# Patient Record
Sex: Male | Born: 1985 | Race: White | Hispanic: No | Smoking: Current every day smoker
Health system: Southern US, Community
[De-identification: ages and names within clinical notes are randomized; demographics above are authoritative.]

## PROBLEM LIST (undated history)

## (undated) DIAGNOSIS — K219 Gastro-esophageal reflux disease without esophagitis: Secondary | ICD-10-CM

## (undated) DIAGNOSIS — F419 Anxiety disorder, unspecified: Secondary | ICD-10-CM

## (undated) DIAGNOSIS — R42 Dizziness and giddiness: Secondary | ICD-10-CM

## (undated) DIAGNOSIS — F32A Depression, unspecified: Secondary | ICD-10-CM

## (undated) DIAGNOSIS — F329 Major depressive disorder, single episode, unspecified: Secondary | ICD-10-CM

## (undated) DIAGNOSIS — Z972 Presence of dental prosthetic device (complete) (partial): Secondary | ICD-10-CM

## (undated) HISTORY — DX: Major depressive disorder, single episode, unspecified: F32.9

## (undated) HISTORY — DX: Dizziness and giddiness: R42

## (undated) HISTORY — DX: Depression, unspecified: F32.A

## (undated) HISTORY — DX: Anxiety disorder, unspecified: F41.9

---

## 2004-08-02 ENCOUNTER — Emergency Department: Payer: Self-pay | Admitting: Emergency Medicine

## 2005-10-21 ENCOUNTER — Emergency Department: Payer: Self-pay | Admitting: Emergency Medicine

## 2005-12-13 ENCOUNTER — Emergency Department: Payer: Self-pay | Admitting: Emergency Medicine

## 2005-12-16 ENCOUNTER — Ambulatory Visit: Payer: Self-pay | Admitting: Otolaryngology

## 2005-12-17 ENCOUNTER — Emergency Department: Payer: Self-pay | Admitting: Emergency Medicine

## 2007-09-16 HISTORY — PX: ULNAR NERVE TRANSPOSITION: SHX2595

## 2007-09-30 ENCOUNTER — Ambulatory Visit (HOSPITAL_BASED_OUTPATIENT_CLINIC_OR_DEPARTMENT_OTHER): Admission: RE | Admit: 2007-09-30 | Discharge: 2007-09-30 | Payer: Self-pay | Admitting: Orthopedic Surgery

## 2008-09-11 ENCOUNTER — Emergency Department: Payer: Self-pay | Admitting: Emergency Medicine

## 2008-09-13 ENCOUNTER — Emergency Department: Payer: Self-pay | Admitting: Emergency Medicine

## 2009-04-22 ENCOUNTER — Emergency Department (HOSPITAL_COMMUNITY): Admission: EM | Admit: 2009-04-22 | Discharge: 2009-04-22 | Payer: Self-pay | Admitting: Emergency Medicine

## 2010-12-22 LAB — BASIC METABOLIC PANEL
GFR calc Af Amer: >60 mL/min (ref >60)
GFR calc non Af Amer: >60 mL/min (ref >60)
Potassium: 3.1 mEq/L — ABNORMAL LOW (ref 3.5–5.1)
Sodium: 140 mEq/L (ref 135–145)

## 2010-12-22 LAB — URINALYSIS, ROUTINE W REFLEX MICROSCOPIC
Nitrite: NEGATIVE
Specific Gravity, Urine: 1.027 (ref 1.005–1.030)
pH: 6.5 (ref 5.0–8.0)

## 2010-12-22 LAB — CBC
HCT: 40.2 % (ref 39.0–52.0)
Hemoglobin: 13.7 g/dL (ref 13.0–17.0)
Platelets: 189 10*3/uL (ref 150–400)
WBC: 15.4 10*3/uL — ABNORMAL HIGH (ref 4.0–10.5)

## 2010-12-22 LAB — DIFFERENTIAL
Eosinophils Relative: 1 % (ref 0–5)
Lymphocytes Relative: 18 % (ref 12–46)
Lymphs Abs: 2.7 10*3/uL (ref 0.7–4.0)
Monocytes Absolute: 0.9 10*3/uL (ref 0.1–1.0)

## 2011-01-28 NOTE — Op Note (Signed)
NAMEMarland Kitchen  MYLAN, SCHWARZ            ACCOUNT NO.:  [id number]   MEDICAL RECORD NO.:  [id number]          PATIENT TYPE:  AMB   LOCATION:  DSC                          FACILITY:  [facility]   PHYSICIAN:  Cindee Salt, M.D.       DATE OF BIRTH:  1986-04-25   DATE OF PROCEDURE:  DATE OF DISCHARGE:                               OPERATIVE REPORT   PREOPERATIVE DIAGNOSIS:  Status post decompression ulnar nerve, left  elbow, with subluxation.   POSTOPERATIVE DIAGNOSIS:  Status post decompression ulnar nerve, left  elbow, with subluxation.   OPERATION:  Submuscular transposition ulnar nerve left elbow.   SURGEON:  Kuzma   ASSISTANT:  None.   ANESTHESIA:  General.   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  He has undergone a decompression but has  had continued discomfort,  complains of pain, numbness and tingling,  positive nerve conductions.  He is desirous of reexploration with  submuscular transposition.  He is aware of risks and complications  including the possibility of infection, recurrence, injury to arteries,  nerves, tendons incomplete relief of symptoms, dystrophy.  He is aware  that this may not improve his symptoms and may actually make him worse,  and the possibility of injury to the posterior branches of the medial  antebrachial cutaneous nerve of the forearm.  He is desirous of  proceeding.  In the preoperative area the patient is seen, questions  again encouraged and answered.  The extremity marked by both the patient  and surgeon.  Antibiotic given.   PROCEDURE:  The patient is brought to the operating room where a general  anesthetic was carried out without difficulty, was prepped using  DuraPrep, supine position, left arm free.  The limb was exsanguinated  with an Esmarch bandage, tourniquet placed high and the arm was inflated  to 250 mmHg.  The old incision was excised extended proximally and  distally on the  medial side of his elbow, carried down through  subcutaneous tissue.  Bleeders were electrocauterized.  The dissection  carefully carried down to the medial epicondyle.  The one branch of  posterior portion of the medial antebrachial cutaneous nerve of the  forearm was identified and protected.  The remaining branches were not  identified nor were they seen in the wound.  The dissection carried  down.  The bursa head formed around the ulnar nerve with  flexion/extension of the elbow.  This was noted to anteriorly dislocate.  With blunt and sharp dissection this was freed proximally to the arcade  of struthers.  Distally a fasciotomy of the flexor carpi ulnaris was  performed.  The medial intermuscular septum was then carefully dissected  and removed.  The fascia on the anterior aspect of the forearm, the  medial pronator flexor pronator mass was then mobilized.  The median  nerve was identified.  The flexor pronator mass was then dissected free  from the medial epicondyle, protecting the branches of the medial  collateral ligament.  This was then elevated.  The ulnar nerve was then  isolated with its vascular bundles.  It was anteriorly transposed next  to the median nerve.  After irrigation the flexor pronator mass was then  reapproximated with figure-of-eight 2-0 FiberWire sutures.  The arm was  placed through full range of motion, no kinking of the nerve was noted.  The wound was again irrigated.  The subcutaneous tissue was then closed  with figure-of-eight 4-0 Vicryl sutures and the skin with interrupted 4-  0 Vicryl Rapide sutures.  Sterile  compressive dressing long-arm posterior elbow splint applied  with the  elbow in flexion.  The wrist in neutral.  The patient tolerated the  procedure well and was taken to the recovery room for observation in  satisfactory condition.  He will be discharged home to return to the  hand center of [facility] in 1 week on Percocet.            ______________________________  Cindee Salt, M.D.     GK/MEDQ  D:  09/30/2007  T:  09/30/2007  Job:  [id number]

## 2011-04-16 ENCOUNTER — Emergency Department: Payer: Self-pay | Admitting: Emergency Medicine

## 2011-06-02 ENCOUNTER — Emergency Department: Payer: Self-pay | Admitting: Unknown Physician Specialty

## 2011-06-05 LAB — POCT HEMOGLOBIN-HEMACUE: Hemoglobin: 15.8

## 2011-07-16 ENCOUNTER — Ambulatory Visit: Payer: Self-pay | Admitting: Pain Medicine

## 2011-08-01 ENCOUNTER — Emergency Department: Payer: Self-pay | Admitting: Emergency Medicine

## 2011-08-20 ENCOUNTER — Ambulatory Visit: Payer: Self-pay | Admitting: Pain Medicine

## 2011-11-12 ENCOUNTER — Ambulatory Visit: Payer: Self-pay | Admitting: Pain Medicine

## 2011-12-03 ENCOUNTER — Ambulatory Visit: Payer: Self-pay | Admitting: Pain Medicine

## 2011-12-23 ENCOUNTER — Ambulatory Visit: Payer: Self-pay | Admitting: Pain Medicine

## 2011-12-29 ENCOUNTER — Ambulatory Visit: Payer: Self-pay | Admitting: Pain Medicine

## 2012-01-14 ENCOUNTER — Ambulatory Visit: Payer: Self-pay | Admitting: Pain Medicine

## 2012-01-28 ENCOUNTER — Ambulatory Visit: Payer: Self-pay | Admitting: Pain Medicine

## 2012-01-29 ENCOUNTER — Encounter: Payer: Self-pay | Admitting: Pain Medicine

## 2012-02-03 ENCOUNTER — Ambulatory Visit: Payer: Self-pay | Admitting: Pain Medicine

## 2012-02-10 ENCOUNTER — Ambulatory Visit: Payer: Self-pay | Admitting: Pain Medicine

## 2012-03-02 ENCOUNTER — Ambulatory Visit: Payer: Self-pay | Admitting: Pain Medicine

## 2012-04-09 ENCOUNTER — Emergency Department: Payer: Self-pay | Admitting: *Deleted

## 2013-08-15 ENCOUNTER — Ambulatory Visit: Payer: Self-pay | Admitting: Physician Assistant

## 2015-01-03 ENCOUNTER — Ambulatory Visit
Admit: 2015-01-03 | Disposition: A | Discharge status: Home / Self Care | Payer: Self-pay | Attending: Family Medicine | Admitting: Family Medicine

## 2015-01-07 NOTE — Op Note (Signed)
PATIENT NAME:  Jose Zimmerman, Jose Zimmerman MR#:  [id number] DATE OF BIRTH:  1986/04/02  DATE OF PROCEDURE:  12/23/2011  Location: Operating Room Referring Physician:  Dr. Daryll Brod  Consulting Pain Physician: Beatriz Chancellor A. Dossie Arbour, M.D.  Note:  This is the case of a 29 year old white male patient who comes into the clinic today for a left-sided cervical epidural spinal cord stimulator trial lead implant under fluoroscopic guidance and IV sedation.    Procedure(s):  1. Temporary (Trial) implantation of Cervical Epidural, Single Percutaneous Neurostimulator Lead (8 electrode array). 2. Fluoroscopic Needle Guidance 3. Intraoperative Analysis and Programming. 4. Postoperative Analysis and Programming. 5. Moderate Conscious Sedation  Surgeon: Clanton Emanuelson A. Dossie Arbour, M.D. 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  Position: Prone.  Prepping solution: DuraPrep Area prepped: The Cervical and Thoracic areas, were prepped with a broad-spectrum topical antiseptic microbicide. Target area: Cervical Epidural Space, around the C2/C3 vertebral body, for the electrode tip. Insertion site is the T1-3 intervertebral space. Level entered: T1-2. Number of attempts: One  Infection Control: Standard Universal Precautions taken (Respiratory Hygiene/Cough Etiquette; Mouth, nose, eye protection; Hand Hygiene; Personal protective equipment (PPE); safe injection practices; and use of masks and disposable sterile surgical gloves) as recommended by the Department of [place] for Disease Control and Prevention (CDC).  Safety Measures: Allergies were reviewed. Appropriate site, procedure, and patient were confirmed by following the Joint Commission's Universal Protocol (UP.01.01.01). The patient was asked to confirm marked site and procedure, before commencing.  The patient was asked about blood thinners, or active infections, both of which were denied. No attempt was made at seeking any paresthesias. Aspiration looking for blood return was conducted prior to injecting. At no point did we inject any substances, as a needle was being advanced.  Pre-procedure Assessment:  A medical history and physical exam were obtained. Relevant documentation was reviewed and verified. Prior to the procedure, the patient was provided with an Audio CD, as well as written information on the procedure, including side-effects, and possible complications. Under the influence of no sedatives, a verbal, as well as a written informed consent were obtained, after having provided information on the risks and possible complications. To fulfill our ethical and legal obligations, as recommended by the American Medical Association's Code of Ethics, we have provided information to the patient about our clinical impression; the nature and purpose of an available treatment or procedure; the risks and benefits of an available treatment or procedure; alternatives; the risk and benefits of the alternative treatment or procedure; and the risks and benefits of not receiving or undergoing a treatment or procedure. The patient was provided information about the risks and possible complications associated with the procedure. These include, but not limited to, failure to achieve desired goals, infection, bleeding, organ or nerve damage, allergic reactions, paralysis, and death. In addition, the patient was informed that Medicine is not an exact science; therefore, there is also the possibility of unforeseen risks and possible complications that may result in a catastrophic outcome. The patient indicated having understood very clearly.  We have given the patient no guarantees and we have made no promises. Ample time was given to the patient to ask questions, all of which were answered, to the patient's  satisfaction, before proceeding. The patient understands that by signing our informed consent form, they understand and accept the risks and the  fact that it is impossible to predict all possible complications. Baseline vital signs were taken and the medical assessment was completed. Verification of the correct person, correct site (including marking of site), and correct procedure were performed and confirmed by the patient. Baseline vital signs were taken and the initial assessment was completed. Verification of the correct person, correct site (including marking of site), and correct procedure were performed and confirmed by the patient, in the form of a "Time Out".  Monitoring: The patient was monitored in the usual manner, using NIBPM, ECG, and pulse oximetry.  IV Access:  An IV access was obtained and secured.  Analgesia:  Moderate (Conscious) Intravenous sedation: Consent was obtained before administering any sedation. Availability of a responsible, adult driver, and NPO status confirmed. Meaningful verbal contact was maintained, with the patient at all times during the procedure. ASA Sedation Guidelines followed. For specifics on pharmacological type and quantity of sedation, please see nursing chart.  Prophylactic Antibiotics:  Cefazolin (1st generation cephalosporin) 1 gm IVPB.  Local Anesthesia: Lidocaine 1%. The skin over the procedure site were infiltrated using a 3 ml Luer-Lok syringe with a 0.5 inch, 25-G needle. Deeper tissues were infiltrated using a 3.0 inch, 22-G spinal needle, under fluoroscopic guidance.  Fluoroscopy: The patient was taken to the operative suite, where the patient was placed in position for the procedure, over the fluoroscopy compatible table. Fluoroscopy was manipulated, using "Tunnel Vision Technique", to obtain the best possible view of the target area, on the affected side. Parallax error was corrected before commencing the procedure. Gabor Racz's  "Direction-Depth-Direction" technique was used to introduce the procedural needle under continuous pulsed fluoroscopic guidance. Once the target was reached, antero-posterior and lateral fluoroscopic views were taken to confirm needle placement in two planes. Fluoroscopy time: Please see the patient's chart for details.  Description of the procedure: The procedure site was prepped using a broad-spectrum topical antiseptic. The area was then draped in the usual and standard manner. "Time-out" was performed as per JC Universal Protocol (UP.01.01.01).   The skin and deeper tissues over the procedure site were infiltrated using 1% lidocaine, loaded in a 10 ml Luer-Lok syringe with a 0.5 inch, 25-G needle. The procedural needle was then introduced through the skin and deeper tissues, using Gabor Racz's "Direction-Depth-Direction" technique, under pulsed fluoroscopic guidance. No attempt was made at seeking a paresthesia. The paramidline approach was used to enter the posterior epidural space at a 30 degree angle, using "Loss-of-resistance Technique" with 3 ml of PF-NaCl (0.9% NSS) + 0.5 ml of air, in a 5 ml glass syringe, using a "loss-of-bounce technique", at the desired level. Correct needle placement was confirmed in the  antero-posterior and lateral fluoroscopic views. The epidural lead was gently introduced under real-time fluoroscopy, constantly assessing for pain or paresthesias, until the tip was observed to be at the target level, on the side ipsilateral to the pain. Once the target was thought to have been reached, antero-posterior and lateral fluoroscopic views were taken to confirm electrode placement in two planes. Placement was tested until a comfortable stimulation pattern was observed over the usual painful area. Once the patient had assured [place] that the stimulation was in the correct pattern, and distribution, we proceeded to remove the 15-G "Tuohy" epidural needle. This was done while observing the  electrode tip under real-time fluoroscopy to prevent movement. The lead was then fixed to the skin using silk 2-0 suture. Benzoin was applied to the area, and 6 (six) 64M, 1/2"X4", reinforced, adhesive Steri  Strips were used to further secure the lead into place. A sterile transparent dressing was then used to cover the lead, in order to assess any evidence of infection in the future.  The patient tolerated the entire procedure well. A repeat set of vitals were taken after the procedure and the patient was kept under observation until discharge criteria was met. The patient was provided with discharge instructions, including a section on how to identify potential problems. Should any problems arise concerning this procedure, the patient was given instructions to immediately contact us, without hesitation. The neurostimulator representative and I, both provided the patient with our Business cards containing our contact telephone numbers, and instructed the patient to contact either one of [place], at any time, should there be any problems or questions. In any case, we plan to contact the patient by telephone for a follow-up status report regarding this interventional procedure.  EBL: 0 ml  Complications: No heme; no paresthesias.  Disposition: Return to clinics in 5-7 days for removal of trial electrodes and evaluation of trial.  Additional Comments/Plan: None.  Equipment used:  Medtronic lead.  Disclaimer: Medicine is not an Chief Strategy Officer. The only guarantee in medicine is that nothing is guaranteed. It is important to note that the decision to proceed with this intervention was based on the information collected from the patient. The Data and conclusions were drawn from the patient's questionnaire, the interview, and the physical examination. Because the information was provided in large part by the patient, it cannot be guaranteed that it has not been purposely or unconsciously manipulated. Every effort  has been made to obtain as much relevant data as possible for this evaluation. It is important to note that the conclusions that lead to this procedure are derived in large part from the available data. Always take into account that the treatment will also be dependent on availability of resources and existing treatment guidelines, considered by other Pain Management Practitioners as being common knowledge and practice, at this time. For Medico-Legal purposes, it is also important to point out that variations in procedural techniques and pharmacological choices are the acceptable norm. The indications, contraindications, technique, and results of the above procedure should only be interpreted and judged by a Board-Certified Interventional Pain Specialist with extensive familiarity and expertise in the same exact procedure and technique, doing otherwise would be inappropriate and unethical.   ____________________________ Kathlen Brunswick. Dossie Arbour, MD fan:bjt D: 12/23/2011 11:47:48 ET T: 12/23/2011 13:46:00 ET JOB#: [id number]  cc: Aviyah Swetz A. Dossie Arbour, MD, <Dictator> Gaspar Cola MD ELECTRONICALLY SIGNED 12/24/2011 14:45

## 2015-01-12 ENCOUNTER — Ambulatory Visit
Admit: 2015-01-12 | Disposition: A | Discharge status: Home / Self Care | Payer: Self-pay | Attending: Family Medicine | Admitting: Family Medicine

## 2015-03-11 ENCOUNTER — Encounter: Payer: Self-pay | Admitting: Emergency Medicine

## 2015-03-11 ENCOUNTER — Emergency Department: Payer: BLUE CROSS/BLUE SHIELD

## 2015-03-11 ENCOUNTER — Emergency Department
Admission: EM | Admit: 2015-03-11 | Discharge: 2015-03-11 | Disposition: A | Discharge status: Home / Self Care | Payer: BLUE CROSS/BLUE SHIELD | Attending: Emergency Medicine | Admitting: Emergency Medicine

## 2015-03-11 DIAGNOSIS — Y998 Other external cause status: Secondary | ICD-10-CM | POA: Insufficient documentation

## 2015-03-11 DIAGNOSIS — W208XXA Other cause of strike by thrown, projected or falling object, initial encounter: Secondary | ICD-10-CM | POA: Insufficient documentation

## 2015-03-11 DIAGNOSIS — S299XXA Unspecified injury of thorax, initial encounter: Secondary | ICD-10-CM | POA: Diagnosis present

## 2015-03-11 DIAGNOSIS — Y9289 Other specified places as the place of occurrence of the external cause: Secondary | ICD-10-CM | POA: Diagnosis not present

## 2015-03-11 DIAGNOSIS — S80811A Abrasion, right lower leg, initial encounter: Secondary | ICD-10-CM | POA: Insufficient documentation

## 2015-03-11 DIAGNOSIS — S2231XA Fracture of one rib, right side, initial encounter for closed fracture: Secondary | ICD-10-CM

## 2015-03-11 DIAGNOSIS — Y9389 Activity, other specified: Secondary | ICD-10-CM | POA: Diagnosis not present

## 2015-03-11 DIAGNOSIS — S2241XA Multiple fractures of ribs, right side, initial encounter for closed fracture: Secondary | ICD-10-CM | POA: Insufficient documentation

## 2015-03-11 DIAGNOSIS — Z720 Tobacco use: Secondary | ICD-10-CM | POA: Diagnosis not present

## 2015-03-11 DIAGNOSIS — R52 Pain, unspecified: Secondary | ICD-10-CM

## 2015-03-11 MED ORDER — OXYCODONE HCL 5 MG PO TABS
ORAL_TABLET | ORAL | Status: AC
  Administered 2015-03-11: 10 mg
  Filled 2015-03-11: qty 2

## 2015-03-11 NOTE — Discharge Instructions (Signed)
Please use incentive spirometer as instructed. Please seek medical attention for any high fevers, chest pain, shortness of breath, change in behavior, persistent vomiting, bloody stool or any other new or concerning symptoms.  Rib Fracture A rib fracture is a break or crack in one of the bones of the ribs. The ribs are a group of long, curved bones that wrap around your chest and attach to your spine. They protect your lungs and other organs in the chest cavity. A broken or cracked rib is often painful, but most do not cause other problems. Most rib fractures heal on their own over time. However, rib fractures can be more serious if multiple ribs are broken or if broken ribs move out of place and push against other structures. CAUSES   A direct blow to the chest. For example, this could happen during contact sports, a car accident, or a fall against a hard object.  Repetitive movements with high force, such as pitching a baseball or having severe coughing spells. SYMPTOMS   Pain when you breathe in or cough.  Pain when someone presses on the injured area. DIAGNOSIS  Your caregiver will perform a physical exam. Various imaging tests may be ordered to confirm the diagnosis and to look for related injuries. These tests may include a chest X-ray, computed tomography (CT), magnetic resonance imaging (MRI), or a bone scan. TREATMENT  Rib fractures usually heal on their own in 1-3 months. The longer healing period is often associated with a continued cough or other aggravating activities. During the healing period, pain control is very important. Medication is usually given to control pain. Hospitalization or surgery may be needed for more severe injuries, such as those in which multiple ribs are broken or the ribs have moved out of place.  HOME CARE INSTRUCTIONS   Avoid strenuous activity and any activities or movements that cause pain. Be careful during activities and avoid bumping the injured  rib.  Gradually increase activity as directed by your caregiver.  Only take over-the-counter or prescription medications as directed by your caregiver. Do not take other medications without asking your caregiver first.  Apply ice to the injured area for the first 1-2 days after you have been treated or as directed by your caregiver. Applying ice helps to reduce inflammation and pain.  Put ice in a plastic bag.  Place a towel between your skin and the bag.   Leave the ice on for 15-20 minutes at a time, every 2 hours while you are awake.  Perform deep breathing as directed by your caregiver. This will help prevent pneumonia, which is a common complication of a broken rib. Your caregiver may instruct you to:  Take deep breaths several times a day.  Try to cough several times a day, holding a pillow against the injured area.  Use a device called an incentive spirometer to practice deep breathing several times a day.  Drink enough fluids to keep your urine clear or pale yellow. This will help you avoid constipation.   Do not wear a rib belt or binder. These restrict breathing, which can lead to pneumonia.  SEEK IMMEDIATE MEDICAL CARE IF:   You have a fever.   You have difficulty breathing or shortness of breath.   You develop a continual cough, or you cough up thick or bloody sputum.  You feel sick to your stomach (nausea), throw up (vomit), or have abdominal pain.   You have worsening pain not controlled with medications.  MAKE  SURE YOU:  Understand these instructions.  Will watch your condition.  Will get help right away if you are not doing well or get worse. Document Released: 09/01/2005 Document Revised: 05/04/2013 Document Reviewed: 11/03/2012 [facility] Patient Information 2015 [place], [place]. This information is not intended to replace advice given to you by your health care provider. Make sure you discuss any questions you have with your health care  provider.
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
## 2015-03-13 ENCOUNTER — Ambulatory Visit
Admission: EM | Admit: 2015-03-13 | Discharge: 2015-03-13 | Disposition: A | Discharge status: Home / Self Care | Payer: BLUE CROSS/BLUE SHIELD | Attending: Family Medicine | Admitting: Family Medicine

## 2015-03-13 ENCOUNTER — Ambulatory Visit: Payer: BLUE CROSS/BLUE SHIELD

## 2015-03-13 DIAGNOSIS — M25561 Pain in right knee: Secondary | ICD-10-CM | POA: Diagnosis present

## 2015-03-13 DIAGNOSIS — S8001XA Contusion of right knee, initial encounter: Secondary | ICD-10-CM | POA: Insufficient documentation

## 2015-03-13 DIAGNOSIS — F17210 Nicotine dependence, cigarettes, uncomplicated: Secondary | ICD-10-CM | POA: Insufficient documentation

## 2015-03-13 DIAGNOSIS — M79661 Pain in right lower leg: Secondary | ICD-10-CM | POA: Diagnosis not present

## 2015-03-13 MED ORDER — KETOROLAC TROMETHAMINE 60 MG/2ML IM SOLN
60.0000 mg | Freq: Once | INTRAMUSCULAR | Status: AC
  Administered 2015-03-13: 60 mg via INTRAMUSCULAR

## 2015-03-13 MED ORDER — HYDROCODONE-ACETAMINOPHEN 5-325 MG PO TABS: ORAL_TABLET | ORAL | Status: DC
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
## 2015-03-13 NOTE — Discharge Instructions (Signed)
Acute Compartment Syndrome  Compartment syndrome is a painful condition that occurs when swelling and pressure build up in a body space (compartment) of the arms or legs. Groups of muscles, nerves, and blood vessels in the arms and legs are separated into various compartments. Each compartment is surrounded by tough layers of tissue called fascia. In compartment syndrome, pressure builds up within the layers of fascia and begins to push on the structures within that compartment.   In acute compartment syndrome, the pressure builds up suddenly, often as the result of an injury. This is a surgical emergency. When a muscle in the compartment moves, you may feel severe pain. If pressure continues to increase, it can block the flow of blood in the smallest blood vessels (capillaries). Then, the nerves and muscles in the compartment cannot get enough oxygen and nutrients (substances needed for survival). They will start to die within 4-8 hours. That is why the pressure needs to be relieved immediately. Identifying the condition early and treating it quickly can prevent most problems.  CAUSES   Various things can lead to compartment syndrome. Possible causes include:   · Injury. Some injuries can cause swelling or bleeding in a compartment. This can lead to compartment syndrome. Injuries that may cause this problem include:  ¨ Broken bones, especially the long bones of the arms and legs.  ¨ Crushing injuries.  ¨ Penetrating injuries, such as a knife wound that punctures the skin and tissue underneath.  ¨ Badly bruised muscles.  ¨ Poisonous bites, such as a snake bite.  ¨ Severe burns.  · Blocked blood flow. This could result from:  ¨ A cast or bandage that is too tight.  ¨ A surgical procedure. Blood flow sometimes has to be stopped for a while during a surgery, usually with a tourniquet.  ¨ Lying for too long in a position that restricts blood flow. This can happen in people who have nerve damage or if a person is  unconscious for a long time.  ¨ Drugs used to build up muscles (anabolic steroids).  ¨ Drugs that keep the blood from forming clots (blood thinners).  SIGNS AND SYMPTOMS   The most common symptom of compartment syndrome is pain. The pain may:   · Get worse when moving or stretching the affected body part.  · Be more severe than it should be for an injury.  · Come along with a feeling of tingling or burning.  · Become worse when the area is pushed or squeezed.  · Be unaffected by pain medicine.  Other symptoms include:   · A feeling of tightness or fullness in the affected area.    · A loss of feeling.  · Weakness in the area.  · Loss of movement.  · Skin becoming pale, tight, and shiny over the painful area.    DIAGNOSIS   Your health care provider may suspect the problem based on how you describe the pain. The diagnosis is made by using a special device that measures the pressure in the affected area. Blood tests, X-rays, or an ultrasound exam may be done to help rule out other problems.   TREATMENT   Compartment syndrome is a surgical emergency. It should be treated very quickly.   · First-aid treatment is given first. This may include:  ¨ Promptly treating an injury.  ¨ Loosening or removing any cast, bandage, or external wrap that may be causing pain.  ¨ Raising the painful arm or leg to the same level   as the heart.  ¨ Giving oxygen.  ¨ Giving fluid through an IV access tube that is put into a vein in the hand or arm.  · Surgery (fasciotomy) is needed to relieve the pressure and help prevent permanent damage. In this surgery, cuts (incisions) are made through the fascia to relieve the pressure in the compartment.  Document Released: 08/20/2009 Document Revised: 05/04/2013 Document Reviewed: 04/05/2013  ExitCare® Patient Information ©2015 [employer]. This information is not intended to replace advice given to you by your health care provider. Make sure you discuss any questions you have with your health care  provider.
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
## 2015-03-14 ENCOUNTER — Encounter: Payer: Self-pay | Admitting: Physician Assistant

## 2015-03-14 ENCOUNTER — Other Ambulatory Visit: Payer: Self-pay | Admitting: Unknown Physician Specialty

## 2015-03-14 MED ORDER — MORPHINE SULFATE ER 20 MG PO CP24: 40.0000 mg | ORAL_CAPSULE | Freq: Every day | ORAL | Status: DC

## 2015-03-15 DIAGNOSIS — G894 Chronic pain syndrome: Secondary | ICD-10-CM

## 2015-03-15 DIAGNOSIS — K219 Gastro-esophageal reflux disease without esophagitis: Secondary | ICD-10-CM | POA: Insufficient documentation

## 2015-03-15 DIAGNOSIS — G2581 Restless legs syndrome: Secondary | ICD-10-CM | POA: Insufficient documentation

## 2015-03-15 DIAGNOSIS — F418 Other specified anxiety disorders: Secondary | ICD-10-CM | POA: Insufficient documentation

## 2015-03-16 ENCOUNTER — Encounter: Payer: Self-pay | Admitting: Unknown Physician Specialty

## 2015-03-16 ENCOUNTER — Ambulatory Visit (INDEPENDENT_AMBULATORY_CARE_PROVIDER_SITE_OTHER): Payer: BLUE CROSS/BLUE SHIELD | Admitting: Unknown Physician Specialty

## 2015-03-16 VITALS — BP 127/84 | HR 102 | Temp 97.8°F | Ht 68.3 in | Wt 176.2 lb

## 2015-03-16 DIAGNOSIS — S2231XD Fracture of one rib, right side, subsequent encounter for fracture with routine healing: Secondary | ICD-10-CM | POA: Diagnosis not present

## 2015-03-16 DIAGNOSIS — G894 Chronic pain syndrome: Secondary | ICD-10-CM | POA: Diagnosis not present

## 2015-03-16 MED ORDER — MORPHINE SULFATE ER 30 MG PO CP24: 30.0000 mg | ORAL_CAPSULE | Freq: Every day | ORAL | Status: DC
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
## 2015-03-16 NOTE — Assessment & Plan Note (Signed)
Will continue with taper of narcotics.  Will just rx his long acting Morphine at 30 mg.  Continue tapering 10 mg/month

## 2015-04-06 ENCOUNTER — Other Ambulatory Visit: Payer: Self-pay | Admitting: Unknown Physician Specialty

## 2015-04-06 MED ORDER — MORPHINE SULFATE ER 20 MG PO CP24: 20.0000 mg | ORAL_CAPSULE | Freq: Every day | ORAL | Status: DC

## 2015-04-13 ENCOUNTER — Ambulatory Visit: Payer: Self-pay | Admitting: Unknown Physician Specialty

## 2015-05-04 ENCOUNTER — Other Ambulatory Visit: Payer: Self-pay | Admitting: Unknown Physician Specialty

## 2015-05-04 MED ORDER — MORPHINE SULFATE ER 10 MG PO CP24: 10.0000 mg | ORAL_CAPSULE | Freq: Every day | ORAL | Status: DC

## 2015-05-05 ENCOUNTER — Other Ambulatory Visit: Payer: Self-pay

## 2015-05-05 ENCOUNTER — Emergency Department
Admission: EM | Admit: 2015-05-05 | Discharge: 2015-05-06 | Disposition: A | Discharge status: Home / Self Care | Payer: BLUE CROSS/BLUE SHIELD | Attending: Emergency Medicine | Admitting: Emergency Medicine

## 2015-05-05 DIAGNOSIS — R1013 Epigastric pain: Secondary | ICD-10-CM | POA: Diagnosis present

## 2015-05-05 DIAGNOSIS — Z720 Tobacco use: Secondary | ICD-10-CM | POA: Insufficient documentation

## 2015-05-05 DIAGNOSIS — K2100 Gastro-esophageal reflux disease with esophagitis, without bleeding: Secondary | ICD-10-CM

## 2015-05-05 DIAGNOSIS — Z79899 Other long term (current) drug therapy: Secondary | ICD-10-CM | POA: Diagnosis not present

## 2015-05-05 LAB — COMPREHENSIVE METABOLIC PANEL
ALBUMIN: 4.5 g/dL (ref 3.5–5.0)
ALT: 30 U/L (ref 17–63)
ANION GAP: 10 (ref 5–15)
AST: 26 U/L (ref 15–41)
Alkaline Phosphatase: 73 U/L (ref 38–126)
BILIRUBIN TOTAL: 0.2 mg/dL — AB (ref 0.3–1.2)
BUN: 14 mg/dL (ref 6–20)
CO2: 27 mmol/L (ref 22–32)
Calcium: 10.2 mg/dL (ref 8.9–10.3)
Chloride: 104 mmol/L (ref 101–111)
Creatinine, Ser: 0.98 mg/dL (ref 0.61–1.24)
GFR calc Af Amer: >60 mL/min (ref >60)
Glucose, Bld: 119 mg/dL — ABNORMAL HIGH (ref 65–99)
POTASSIUM: 3.5 mmol/L (ref 3.5–5.1)
Sodium: 141 mmol/L (ref 135–145)
TOTAL PROTEIN: 7.1 g/dL (ref 6.5–8.1)

## 2015-05-05 LAB — URINALYSIS COMPLETE WITH MICROSCOPIC (ARMC ONLY)
BILIRUBIN URINE: NEGATIVE
Bacteria, UA: NONE SEEN
GLUCOSE, UA: NEGATIVE mg/dL
HGB URINE DIPSTICK: NEGATIVE
Ketones, ur: NEGATIVE mg/dL
LEUKOCYTES UA: NEGATIVE
NITRITE: NEGATIVE
Protein, ur: NEGATIVE mg/dL
SPECIFIC GRAVITY, URINE: 1.024 (ref 1.005–1.030)
Squamous Epithelial / LPF: NONE SEEN
pH: 5 (ref 5.0–8.0)

## 2015-05-05 LAB — CBC WITH DIFFERENTIAL/PLATELET
BASOS PCT: 1 %
Basophils Absolute: 0.1 10*3/uL (ref 0–0.1)
EOS PCT: 1 %
Eosinophils Absolute: 0.1 10*3/uL (ref 0–0.7)
HEMATOCRIT: 45.2 % (ref 40.0–52.0)
Hemoglobin: 15.2 g/dL (ref 13.0–18.0)
Lymphocytes Relative: 22 %
Lymphs Abs: 2.9 10*3/uL (ref 1.0–3.6)
MCH: 28.3 pg (ref 26.0–34.0)
MCHC: 33.5 g/dL (ref 32.0–36.0)
MCV: 84.5 fL (ref 80.0–100.0)
MONO ABS: 1 10*3/uL (ref 0.2–1.0)
MONOS PCT: 8 %
NEUTROS ABS: 9 10*3/uL — AB (ref 1.4–6.5)
Neutrophils Relative %: 68 %
Platelets: 249 10*3/uL (ref 150–440)
RBC: 5.36 MIL/uL (ref 4.40–5.90)
RDW: 13.8 % (ref 11.5–14.5)
WBC: 13.1 10*3/uL — ABNORMAL HIGH (ref 3.8–10.6)

## 2015-05-05 LAB — LIPASE, BLOOD: LIPASE: 33 U/L (ref 22–51)

## 2015-05-05 NOTE — ED Notes (Signed)
Pt reports midsternal and epigastric pain after eating x 4 days. Denies SOB. +n/v. States pain moved into mid abd today. Reports blood in stool today.

## 2015-05-06 MED ORDER — PANTOPRAZOLE SODIUM 40 MG PO TBEC
40.0000 mg | DELAYED_RELEASE_TABLET | ORAL | Status: AC
  Administered 2015-05-06: 40 mg via ORAL
  Filled 2015-05-06: qty 1

## 2015-05-06 MED ORDER — FAMOTIDINE 20 MG PO TABS
40.0000 mg | ORAL_TABLET | Freq: Once | ORAL | Status: AC
  Administered 2015-05-06: 40 mg via ORAL
  Filled 2015-05-06: qty 2

## 2015-05-06 MED ORDER — GI COCKTAIL ~~LOC~~
30.0000 mL | ORAL | Status: AC
  Administered 2015-05-06: 30 mL via ORAL
  Filled 2015-05-06: qty 30

## 2015-05-06 MED ORDER — ONDANSETRON 8 MG PO TBDP: 8.0000 mg | ORAL_TABLET | Freq: Three times a day (TID) | ORAL | Status: DC | PRN

## 2015-05-06 MED ORDER — RANITIDINE HCL 150 MG PO CAPS: 150.0000 mg | ORAL_CAPSULE | Freq: Two times a day (BID) | ORAL | Status: DC

## 2015-05-06 MED ORDER — SUCRALFATE 1 G PO TABS: 1.0000 g | ORAL_TABLET | Freq: Four times a day (QID) | ORAL | Status: DC

## 2015-05-06 NOTE — Discharge Instructions (Signed)
Gastroesophageal Reflux Disease, Adult Gastroesophageal reflux disease (GERD) happens when acid from your stomach flows up into the esophagus. When acid comes in contact with the esophagus, the acid causes soreness (inflammation) in the esophagus. Over time, GERD may create small holes (ulcers) in the lining of the esophagus. CAUSES   Increased body weight. This puts pressure on the stomach, making acid rise from the stomach into the esophagus.  Smoking. This increases acid production in the stomach.  Drinking alcohol. This causes decreased pressure in the lower esophageal sphincter (valve or ring of muscle between the esophagus and stomach), allowing acid from the stomach into the esophagus.  Late evening meals and a full stomach. This increases pressure and acid production in the stomach.  A malformed lower esophageal sphincter. Sometimes, no cause is found. SYMPTOMS   Burning pain in the lower part of the mid-chest behind the breastbone and in the mid-stomach area. This may occur twice a week or more often.  Trouble swallowing.  Sore throat.  Dry cough.  Asthma-like symptoms including chest tightness, shortness of breath, or wheezing. DIAGNOSIS  Your caregiver may be able to diagnose GERD based on your symptoms. In some cases, X-rays and other tests may be done to check for complications or to check the condition of your stomach and esophagus. TREATMENT  Your caregiver may recommend over-the-counter or prescription medicines to help decrease acid production. Ask your caregiver before starting or adding any new medicines.  HOME CARE INSTRUCTIONS   Change the factors that you can control. Ask your caregiver for guidance concerning weight loss, quitting smoking, and alcohol consumption.  Avoid foods and drinks that make your symptoms worse, such as:  Caffeine or alcoholic drinks.  Chocolate.  Peppermint or mint flavorings.  Garlic and onions.  Spicy foods.  Citrus fruits,  such as oranges, lemons, or limes.  Tomato-based foods such as sauce, chili, salsa, and pizza.  Fried and fatty foods.  Avoid lying down for the 3 hours prior to your bedtime or prior to taking a nap.  Eat small, frequent meals instead of large meals.  Wear loose-fitting clothing. Do not wear anything tight around your waist that causes pressure on your stomach.  Raise the head of your bed 6 to 8 inches with wood blocks to help you sleep. Extra pillows will not help.  Only take over-the-counter or prescription medicines for pain, discomfort, or fever as directed by your caregiver.  Do not take aspirin, ibuprofen, or other nonsteroidal anti-inflammatory drugs (NSAIDs). SEEK IMMEDIATE MEDICAL CARE IF:   You have pain in your arms, neck, jaw, teeth, or back.  Your pain increases or changes in intensity or duration.  You develop nausea, vomiting, or sweating (diaphoresis).  You develop shortness of breath, or you faint.  Your vomit is green, yellow, black, or looks like coffee grounds or blood.  Your stool is red, bloody, or black. These symptoms could be signs of other problems, such as heart disease, gastric bleeding, or esophageal bleeding. MAKE SURE YOU:   Understand these instructions.  Will watch your condition.  Will get help right away if you are not doing well or get worse. Document Released: 06/11/2005 Document Revised: 11/24/2011 Document Reviewed: 03/21/2011 ExitCare Patient Information 2015 [employer]. This information is not intended to replace advice given to you by your health care provider. Make sure you discuss any questions you have with your health care provider.  

## 2015-05-06 NOTE — ED Provider Notes (Signed)
[facility] Emergency Department Provider Note  ____________________________________________  Time seen: 1:45 AM  I have reviewed the triage vital signs and the nursing notes.   HISTORY  Chief Complaint Rectal Bleeding    HPI Jose Zimmerman is a 29 y.o. male reports midsternal and epigastric pain for the past 4 days. This started after eating a sub-sandwich about 4 days ago. Since then he's had persistent gnawing pain in the epigastrium that worsens after eating and causes him to vomit. No diarrhea and has had normal bowel movements, but states that today around 8:00 PM he had a bowel movement and noticed a significant amount of blood in the toilet. It was mostly on the stool because the water did turn somewhat red as well. Denies any pain with bowel movements or rectal pain.  There is a history of the paternal grandmother having Crohn's.  The patient does have a history of severe GERD. He previously had been taking ranitidine and omeprazole, but stopped taking those several months ago. When the symptoms started 4 days ago, he took a single dose of omeprazole given to him by his parents, but has not continue that.    Past Medical History  Diagnosis Date  . Depression   . Anxiety   . Vertigo     Patient Active Problem List   Diagnosis Date Noted  . Depression with anxiety 03/15/2015  . Chronic pain syndrome 03/15/2015  . GERD (gastroesophageal reflux disease) 03/15/2015  . Restless legs syndrome 03/15/2015    Past Surgical History  Procedure Laterality Date  . Ulnar nerve transposition Left 2009    Current Outpatient Rx  Name  Route  Sig  Dispense  Refill  . cyclobenzaprine (FLEXERIL) 10 MG tablet               . naproxen sodium (ALEVE) 220 MG tablet   Oral   Take 440 mg by mouth.         . ranitidine (ZANTAC) 150 MG capsule   Oral   Take 1 capsule (150 mg total) by mouth 2 (two) times daily.   28 capsule   0   . sucralfate  (CARAFATE) 1 G tablet   Oral   Take 1 tablet (1 g total) by mouth 4 (four) times daily.   120 tablet   1     Allergies Gabapentin; Keppra; and Tramadol  Family History  Problem Relation Age of Onset  . Osteoporosis Mother   . Diabetes Father   . Hypertension Father   . Hyperlipidemia Father     Social History Social History  Substance Use Topics  . Smoking status: Current Every Day Smoker -- 0.50 packs/day for 10 years  . Smokeless tobacco: Former Neurosurgeon  . Alcohol Use: No    Review of Systems  Constitutional: No fever or chills. No weight changes Eyes:No blurry vision or double vision.  ENT: No sore throat. Cardiovascular: No chest pain. Respiratory: No dyspnea or cough. Gastrointestinal: Epigastric abdominal pain. Positive hematochezia per patient report earlier today. Only one bowel movement today. Bowel movements have been regular without constipation or straining or history of hemorrhoids. Genitourinary: Negative for dysuria, urinary retention, bloody urine, or difficulty urinating. Musculoskeletal: Negative for back pain. No joint swelling or pain. Skin: Negative for rash. Neurological: Negative for headaches, focal weakness or numbness. Psychiatric:No anxiety or depression.   Endocrine:No hot/cold intolerance, changes in energy, or sleep difficulty.  10-point ROS otherwise negative.  ____________________________________________   PHYSICAL EXAM:  VITAL  SIGNS: ED Triage Vitals  Enc Vitals Group     BP 05/05/15 2200 152/93 mmHg     Pulse Rate 05/05/15 2200 118     Resp 05/05/15 2200 20     Temp 05/05/15 2200 98.1 F (36.7 C)     Temp Source 05/05/15 2200 Oral     SpO2 05/05/15 2200 98 %     Weight 05/05/15 2200 165 lb (74.844 kg)     Height 05/05/15 2200  (1.778 m)     Head Cir --      Peak Flow --      Pain Score 05/05/15 2201 6     Pain Loc --      Pain Edu? --      Excl. in GC? --      Constitutional: Alert and oriented. Well  appearing and in no distress. Eyes: No scleral icterus. No conjunctival pallor. PERRL. EOMI ENT   Head: Normocephalic and atraumatic.   Nose: No congestion/rhinnorhea. No septal hematoma   Mouth/Throat: MMM, no pharyngeal erythema. No peritonsillar mass. No uvula shift.   Neck: No stridor. No SubQ emphysema. No meningismus. Hematological/Lymphatic/Immunilogical: No cervical lymphadenopathy. Cardiovascular: RRR. Normal and symmetric distal pulses are present in all extremities. No murmurs, rubs, or gallops. Respiratory: Normal respiratory effort without tachypnea nor retractions. Breath sounds are clear and equal bilaterally. No wheezes/rales/rhonchi. Gastrointestinal: Soft with mild epigastric tenderness. No distention. There is no CVA tenderness.  No rebound, rigidity, or guarding. Rectal exam reveals no external or internal hemorrhoids. Prostate nontender. Brown stool that is Hemoccult negative. QC controls okay Genitourinary: deferred Musculoskeletal: Nontender with normal range of motion in all extremities. No joint effusions.  No lower extremity tenderness.  No edema. Neurologic:   Normal speech and language.  CN 2-10 normal. Motor grossly intact. Normal gait. No gross focal neurologic deficits are appreciated.  Skin:  Skin is warm, dry and intact. No rash noted.  No petechiae, purpura, or bullae. Psychiatric: Mood and affect are normal. Speech and behavior are normal. Patient exhibits appropriate insight and judgment.  ____________________________________________    LABS (pertinent positives/negatives) (all labs ordered are listed, but only abnormal results are displayed) Labs Reviewed  CBC WITH DIFFERENTIAL/PLATELET - Abnormal; Notable for the following:    WBC 13.1 (*)    Neutro Abs 9.0 (*)    All other components within normal limits  COMPREHENSIVE METABOLIC PANEL - Abnormal; Notable for the following:    Glucose, Bld 119 (*)    Total Bilirubin 0.2 (*)    All  other components within normal limits  URINALYSIS COMPLETEWITH MICROSCOPIC ([facility] ONLY) - Abnormal; Notable for the following:    Color, Urine YELLOW (*)    APPearance CLEAR (*)    All other components within normal limits  LIPASE, BLOOD   ____________________________________________   EKG  EKG interpreted by me Sinus tachycardia rate 117, normal axis intervals QRS and ST segments and T waves  ____________________________________________    RADIOLOGY    ____________________________________________   PROCEDURES  ____________________________________________   INITIAL IMPRESSION / ASSESSMENT AND PLAN / ED COURSE  Pertinent labs & imaging results that were available during my care of the patient were reviewed by me and considered in my medical decision making (see chart for details).  Labs and exam are unremarkable. Symptoms appear to be a recurrence of gastroesophageal reflux disease and gastritis. Low suspicion of perforation or abscess obstruction or GI bleed. Low suspicion of torsion prostatitis or other genital problems. We'll discharge home with  antiemetics and antacids, follow up with GI and primary care.  ____________________________________________   FINAL CLINICAL IMPRESSION(S) / ED DIAGNOSES  Final diagnoses:  Gastroesophageal reflux disease with esophagitis      Sharman Cheek, MD 05/06/15 [phone]

## 2015-05-16 ENCOUNTER — Telehealth: Payer: Self-pay | Admitting: Unknown Physician Specialty

## 2015-05-16 MED ORDER — CYCLOBENZAPRINE HCL 10 MG PO TABS: 10.0000 mg | ORAL_TABLET | Freq: Three times a day (TID) | ORAL | Status: DC

## 2015-05-16 NOTE — Telephone Encounter (Signed)
Pt would like refill on flexeril sent to [facility] court

## 2015-05-16 NOTE — Telephone Encounter (Signed)
Called and let patient know refill was sent in.  

## 2015-06-05 ENCOUNTER — Other Ambulatory Visit: Payer: Self-pay | Admitting: Unknown Physician Specialty

## 2015-06-05 MED ORDER — MORPHINE SULFATE ER 10 MG PO CP24: 10.0000 mg | ORAL_CAPSULE | Freq: Every day | ORAL | Status: DC

## 2015-06-20 ENCOUNTER — Other Ambulatory Visit: Payer: Self-pay | Admitting: Unknown Physician Specialty

## 2015-06-27 ENCOUNTER — Other Ambulatory Visit: Payer: Self-pay | Admitting: Unknown Physician Specialty

## 2015-08-10 ENCOUNTER — Encounter: Payer: Self-pay | Admitting: Emergency Medicine

## 2015-08-10 ENCOUNTER — Ambulatory Visit
Admission: EM | Admit: 2015-08-10 | Discharge: 2015-08-10 | Disposition: A | Discharge status: Home / Self Care | Payer: BLUE CROSS/BLUE SHIELD | Attending: Family Medicine | Admitting: Family Medicine

## 2015-08-10 ENCOUNTER — Other Ambulatory Visit: Payer: Self-pay | Admitting: Unknown Physician Specialty

## 2015-08-10 DIAGNOSIS — H6593 Unspecified nonsuppurative otitis media, bilateral: Secondary | ICD-10-CM | POA: Diagnosis not present

## 2015-08-10 DIAGNOSIS — J209 Acute bronchitis, unspecified: Secondary | ICD-10-CM | POA: Diagnosis not present

## 2015-08-10 DIAGNOSIS — J0111 Acute recurrent frontal sinusitis: Secondary | ICD-10-CM

## 2015-08-10 HISTORY — DX: Gastro-esophageal reflux disease without esophagitis: K21.9

## 2015-08-10 MED ORDER — FLUTICASONE PROPIONATE 50 MCG/ACT NA SUSP: 1.0000 | Freq: Two times a day (BID) | NASAL | Status: DC

## 2015-08-10 MED ORDER — SALINE SPRAY 0.65 % NA SOLN: 2.0000 | NASAL | Status: DC

## 2015-08-10 MED ORDER — PSEUDOEPHEDRINE HCL 30 MG PO TABS: 30.0000 mg | ORAL_TABLET | Freq: Four times a day (QID) | ORAL | Status: DC | PRN

## 2015-08-10 MED ORDER — HYDROCOD POLST-CPM POLST ER 10-8 MG/5ML PO SUER: 5.0000 mL | Freq: Every evening | ORAL | Status: DC | PRN

## 2015-08-10 MED ORDER — DOXYCYCLINE HYCLATE 100 MG PO CAPS: 100.0000 mg | ORAL_CAPSULE | Freq: Two times a day (BID) | ORAL | Status: DC

## 2015-08-10 NOTE — Discharge Instructions (Signed)
Acute Bronchitis °Bronchitis is inflammation of the airways that extend from the windpipe into the lungs (bronchi). The inflammation often causes mucus to develop. This leads to a cough, which is the most common symptom of bronchitis.  °In acute bronchitis, the condition usually develops suddenly and goes away over time, usually in a couple weeks. Smoking, allergies, and asthma can make bronchitis worse. Repeated episodes of bronchitis may cause further lung problems.  °CAUSES °Acute bronchitis is most often caused by the same virus that causes a cold. The virus can spread from person to person (contagious) through coughing, sneezing, and touching contaminated objects. °SIGNS AND SYMPTOMS  °· Cough.   °· Fever.   °· Coughing up mucus.   °· Body aches.   °· Chest congestion.   °· Chills.   °· Shortness of breath.   °· Sore throat.   °DIAGNOSIS  °Acute bronchitis is usually diagnosed through a physical exam. Your health care provider will also ask you questions about your medical history. Tests, such as chest X-rays, are sometimes done to rule out other conditions.  °TREATMENT  °Acute bronchitis usually goes away in a couple weeks. Oftentimes, no medical treatment is necessary. Medicines are sometimes given for relief of fever or cough. Antibiotic medicines are usually not needed but may be prescribed in certain situations. In some cases, an inhaler may be recommended to help reduce shortness of breath and control the cough. A cool mist vaporizer may also be used to help thin bronchial secretions and make it easier to clear the chest.  °HOME CARE INSTRUCTIONS °· Get plenty of rest.   °· Drink enough fluids to keep your urine clear or pale yellow (unless you have a medical condition that requires fluid restriction). Increasing fluids may help thin your respiratory secretions (sputum) and reduce chest congestion, and it will prevent dehydration.   °· Take medicines only as directed by your health care provider. °· If  you were prescribed an antibiotic medicine, finish it all even if you start to feel better. °· Avoid smoking and secondhand smoke. Exposure to cigarette smoke or irritating chemicals will make bronchitis worse. If you are a smoker, consider using nicotine gum or skin patches to help control withdrawal symptoms. Quitting smoking will help your lungs heal faster.   °· Reduce the chances of another bout of acute bronchitis by washing your hands frequently, avoiding people with cold symptoms, and trying not to touch your hands to your mouth, nose, or eyes.   °· Keep all follow-up visits as directed by your health care provider.   °SEEK MEDICAL CARE IF: °Your symptoms do not improve after 1 week of treatment.  °SEEK IMMEDIATE MEDICAL CARE IF: °· You develop an increased fever or chills.   °· You have chest pain.   °· You have severe shortness of breath. °· You have bloody sputum.   °· You develop dehydration. °· You faint or repeatedly feel like you are going to pass out. °· You develop repeated vomiting. °· You develop a severe headache. °MAKE SURE YOU:  °· Understand these instructions. °· Will watch your condition. °· Will get help right away if you are not doing well or get worse. °  °This information is not intended to replace advice given to you by your health care provider. Make sure you discuss any questions you have with your health care provider. °  °Document Released: 10/09/2004 Document Revised: 09/22/2014 Document Reviewed: 02/22/2013 °Elsevier Interactive Patient Education ©2016 [employer] °Sinusitis, Adult °Sinusitis is redness, soreness, and inflammation of the paranasal sinuses. Paranasal sinuses are air pockets within the   bones of your face. They are located beneath your eyes, in the middle of your forehead, and above your eyes. In healthy paranasal sinuses, mucus is able to drain out, and air is able to circulate through them by way of your nose. However, when your paranasal sinuses are inflamed,  mucus and air can become trapped. This can allow bacteria and other germs to grow and cause infection. °Sinusitis can develop quickly and last only a short time (acute) or continue over a long period (chronic). Sinusitis that lasts for more than 12 weeks is considered chronic. °CAUSES °Causes of sinusitis include: °· Allergies. °· Structural abnormalities, such as displacement of the cartilage that separates your nostrils (deviated septum), which can decrease the air flow through your nose and sinuses and affect sinus drainage. °· Functional abnormalities, such as when the small hairs (cilia) that line your sinuses and help remove mucus do not work properly or are not present. °SIGNS AND SYMPTOMS °Symptoms of acute and chronic sinusitis are the same. The primary symptoms are pain and pressure around the affected sinuses. Other symptoms include: °· Upper toothache. °· Earache. °· Headache. °· Bad breath. °· Decreased sense of smell and taste. °· A cough, which worsens when you are lying flat. °· Fatigue. °· Fever. °· Thick drainage from your nose, which often is green and may contain pus (purulent). °· Swelling and warmth over the affected sinuses. °DIAGNOSIS °Your health care provider will perform a physical exam. During your exam, your health care provider may perform any of the following to help determine if you have acute sinusitis or chronic sinusitis: °· Look in your nose for signs of abnormal growths in your nostrils (nasal polyps). °· Tap over the affected sinus to check for signs of infection. °· View the inside of your sinuses using an imaging device that has a light attached (endoscope). °If your health care provider suspects that you have chronic sinusitis, one or more of the following tests may be recommended: °· Allergy tests. °· Nasal culture. A sample of mucus is taken from your nose, sent to a lab, and screened for bacteria. °· Nasal cytology. A sample of mucus is taken from your nose and examined by  your health care provider to determine if your sinusitis is related to an allergy. °TREATMENT °Most cases of acute sinusitis are related to a viral infection and will resolve on their own within 10 days. Sometimes, medicines are prescribed to help relieve symptoms of both acute and chronic sinusitis. These may include pain medicines, decongestants, nasal steroid sprays, or saline sprays. °However, for sinusitis related to a bacterial infection, your health care provider will prescribe antibiotic medicines. These are medicines that will help kill the bacteria causing the infection. °Rarely, sinusitis is caused by a fungal infection. In these cases, your health care provider will prescribe antifungal medicine. °For some cases of chronic sinusitis, surgery is needed. Generally, these are cases in which sinusitis recurs more than 3 times per year, despite other treatments. °HOME CARE INSTRUCTIONS °· Drink plenty of water. Water helps thin the mucus so your sinuses can drain more easily. °· Use a humidifier. °· Inhale steam 3-4 times a day (for example, sit in the bathroom with the shower running). °· Apply a warm, moist washcloth to your face 3-4 times a day, or as directed by your health care provider. °· Use saline nasal sprays to help moisten and clean your sinuses. °· Take medicines only as directed by your health care provider. °· If   you were prescribed either an antibiotic or antifungal medicine, finish it all even if you start to feel better. SEEK IMMEDIATE MEDICAL CARE IF:  You have increasing pain or severe headaches.  You have nausea, vomiting, or drowsiness.  You have swelling around your face.  You have vision problems.  You have a stiff neck.  You have difficulty breathing.   This information is not intended to replace advice given to you by your health care provider. Make sure you discuss any questions you have with your health care provider.   Document Released: 09/01/2005 Document  Revised: 09/22/2014 Document Reviewed: 09/16/2011 Elsevier Interactive Patient Education 2016 [employer] Viral Infections A virus is a type of germ. Viruses can cause:  Minor sore throats.  Aches and pains.  Headaches.  Runny nose.  Rashes.  Watery eyes.  Tiredness.  Coughs.  Loss of appetite.  Feeling sick to your stomach (nausea).  Throwing up (vomiting).  Watery poop (diarrhea). HOME CARE   Only take medicines as told by your doctor.  Drink enough water and fluids to keep your pee (urine) clear or pale yellow. Sports drinks are a good choice.  Get plenty of rest and eat healthy. Soups and broths with crackers or rice are fine. GET HELP RIGHT AWAY IF:   You have a very bad headache.  You have shortness of breath.  You have chest pain or neck pain.  You have an unusual rash.  You cannot stop throwing up.  You have watery poop that does not stop.  You cannot keep fluids down.  You or your child has a temperature by mouth above 102 F (38.9 C), not controlled by medicine.  Your baby is older than 3 months with a rectal temperature of 102 F (38.9 C) or higher.  Your baby is 50 months old or younger with a rectal temperature of 100.4 F (38 C) or higher. MAKE SURE YOU:   Understand these instructions.  Will watch this condition.  Will get help right away if you are not doing well or get worse.   This information is not intended to replace advice given to you by your health care provider. Make sure you discuss any questions you have with your health care provider.   Document Released: 08/14/2008 Document Revised: 11/24/2011 Document Reviewed: 02/07/2015 Elsevier Interactive Patient Education 2016 [employer] Otitis Media With Effusion Otitis media with effusion is the presence of fluid in the middle ear. This is a common problem in children, which often follows ear infections. It may be present for weeks or longer after the infection. Unlike  an acute ear infection, otitis media with effusion refers only to fluid behind the ear drum and not infection. Children with repeated ear and sinus infections and allergy problems are the most likely to get otitis media with effusion. CAUSES  The most frequent cause of the fluid buildup is dysfunction of the eustachian tubes. These are the tubes that drain fluid in the ears to the back of the nose (nasopharynx). SYMPTOMS   The main symptom of this condition is hearing loss. As a result, you or your child may:  Listen to the TV at a loud volume.  Not respond to questions.  Ask "what" often when spoken to.  Mistake or confuse one sound or word for another.  There may be a sensation of fullness or pressure but usually not pain. DIAGNOSIS   Your health care provider will diagnose this condition by examining you or your child's  ears.  Your health care provider may test the pressure in you or your child's ear with a tympanometer.  A hearing test may be conducted if the problem persists. TREATMENT   Treatment depends on the duration and the effects of the effusion.  Antibiotics, decongestants, nose drops, and cortisone-type drugs (tablets or nasal spray) may not be helpful.  Children with persistent ear effusions may have delayed language or behavioral problems. Children at risk for developmental delays in hearing, learning, and speech may require referral to a specialist earlier than children not at risk.  You or your child's health care provider may suggest a referral to an ear, nose, and throat surgeon for treatment. The following may help restore normal hearing:  Drainage of fluid.  Placement of ear tubes (tympanostomy tubes).  Removal of adenoids (adenoidectomy). HOME CARE INSTRUCTIONS   Avoid secondhand smoke.  Infants who are breastfed are less likely to have this condition.  Avoid feeding infants while they are lying flat.  Avoid known environmental allergens.  Avoid  people who are sick. SEEK MEDICAL CARE IF:   Hearing is not better in 3 months.  Hearing is worse.  Ear pain.  Drainage from the ear.  Dizziness. MAKE SURE YOU:   Understand these instructions.  Will watch your condition.  Will get help right away if you are not doing well or get worse.   This information is not intended to replace advice given to you by your health care provider. Make sure you discuss any questions you have with your health care provider.   Document Released: 10/09/2004 Document Revised: 09/22/2014 Document Reviewed: 03/29/2013 Else[employer] Interactive Patient Education [employer]

## 2015-08-10 NOTE — ED Notes (Signed)
Cough, congested, sore throat, chest hurts when cough, runny nose for 2 days

## 2015-08-10 NOTE — ED Provider Notes (Signed)
CSN: [id number]     Arrival date & time 08/10/15  1319 History   First MD Initiated Contact with Patient 08/10/15 1406     Chief Complaint  Patient presents with  . 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  Left work early today feeling so bad tried PCM but they closed at noon today tried nyquil and mucinex OTC without any relief of symptoms.  Has used flonase in the past with good relief.  [facility]  Smoker 1/2 PPD Denied alcohol intake since son born.    The history is provided by the patient.    Past Medical History  Diagnosis Date  . Depression   . Anxiety   . Vertigo   . GERD (gastroesophageal reflux disease)    Past Surgical History  Procedure Laterality Date  . Ulnar nerve transposition Left 2009   Family History  Problem Relation Age of Onset  . Osteoporosis Mother   . Diabetes Father   . Hypertension Father   . Hyperlipidemia Father    Social History  Substance Use Topics  . Smoking status: Current Every Day Smoker -- 0.50 packs/day for 10 years  . Smokeless tobacco: Former Neurosurgeon  . Alcohol Use: No    Review of Systems  Constitutional: Positive for diaphoresis. Negative for fever, chills, activity change, appetite change, fatigue and unexpected weight change.  HENT: Positive for congestion, postnasal drip, rhinorrhea, sinus pressure and sore throat. Negative for dental problem, drooling, ear discharge, ear pain, facial swelling, hearing loss, mouth sores, nosebleeds, sneezing, tinnitus, trouble swallowing and voice change.   Eyes: Negative for photophobia, pain, discharge, redness, itching and visual disturbance.  Respiratory: Positive for cough. Negative for  choking, chest tightness, shortness of breath, wheezing and stridor.   Cardiovascular: Negative for chest pain, palpitations and leg swelling.  Gastrointestinal: Negative for nausea, vomiting, abdominal pain, diarrhea, constipation, blood in stool and abdominal distention.  Endocrine: Negative for cold intolerance and heat intolerance.  Genitourinary: Negative for dysuria.  Musculoskeletal: Negative for myalgias, back pain, joint swelling, arthralgias, gait problem, neck pain and neck stiffness.  Skin: Negative for color change, pallor, rash and wound.  Allergic/Immunologic: Positive for environmental allergies. Negative for food allergies and immunocompromised state.  Neurological: Negative for dizziness, tremors, seizures, syncope, facial asymmetry, speech difficulty, weakness, light-headedness, numbness and headaches.  Hematological: Negative for adenopathy. Does not bruise/bleed easily.  Psychiatric/Behavioral: Positive for sleep disturbance. Negative for behavioral problems, confusion and agitation.    Allergies  Gabapentin; Keppra; and Tramadol  Home Medications   Prior to Admission medications   Medication Sig Start Date End Date Taking? Authorizing Provider  cyclobenzaprine (FLEXERIL) 10 MG tablet 1 Tablet 3 times a day ORAL 06/20/15  Yes Gabriel Cirri, NP  naproxen sodium (ALEVE) 220 MG tablet Take 440 mg by mouth.   Yes Historical Provider, MD  omeprazole (PRILOSEC) 20 MG capsule Take 20 mg by mouth daily.   Yes Historical Provider, MD  doxycycline (VIBRAMYCIN) 100 MG capsule Take 1 capsule (100 mg total) by mouth 2 (two) times daily. 08/10/15   Barbaraann Barthel, NP  fluticasone (FLONASE) 50 MCG/ACT nasal spray Place 1 spray into both nostrils 2 (two) times daily. 08/10/15   Barbaraann Barthel, NP  pseudoephedrine (SUDAFED) 30 MG tablet Take 1 tablet (30 mg total)  by mouth every 6 (six) hours as needed for congestion (max 8 tabs in 24 hours). 08/10/15   Barbaraann Barthel, NP   sodium chloride (OCEAN) 0.65 % SOLN nasal spray Place 2 sprays into both nostrils every 2 (two) hours while awake. 08/10/15   Barbaraann Barthel, NP   Meds Ordered and Administered this Visit  Medications - No data to display  BP 120/82 mmHg  Pulse 110  Temp(Src) 98.4 F (36.9 C) (Tympanic)  Resp 16  Ht  (1.803 m)  Wt 170 lb (77.111 kg)  BMI 23.72 kg/m2  SpO2 100% No data found.   Physical Exam  Constitutional: He is oriented to person, place, and time. Vital signs are normal. He appears well-developed and well-nourished. He is active and cooperative.  Non-toxic appearance. He does not have a sickly appearance. He appears ill. No distress.  HENT:  Head: Normocephalic and atraumatic.  Right Ear: Hearing, external ear and ear canal normal. A middle ear effusion is present.  Left Ear: Hearing, external ear and ear canal normal. A middle ear effusion is present.  Nose: Mucosal edema and rhinorrhea present. No nose lacerations, sinus tenderness, nasal deformity, septal deviation or nasal septal hematoma. No epistaxis.  No foreign bodies. Right sinus exhibits no maxillary sinus tenderness and no frontal sinus tenderness. Left sinus exhibits no maxillary sinus tenderness and no frontal sinus tenderness.  Mouth/Throat: Uvula is midline and mucous membranes are normal. Mucous membranes are not pale, not dry and not cyanotic. He does not have dentures. No oral lesions. No trismus in the jaw. Normal dentition. No dental abscesses, uvula swelling, lacerations or dental caries. Posterior oropharyngeal edema and posterior oropharyngeal erythema present. No oropharyngeal exudate or tonsillar abscesses.  Cobblestoning posterior pharynx; bilateral TMS with air fluid level clear; bilateral nasal turbinates with edema/erythema clear discharge  Eyes: Conjunctivae, EOM and lids are normal. Pupils are equal, round, and reactive to light. Right eye exhibits no chemosis, no discharge, no exudate and no  hordeolum. No foreign body present in the right eye. Left eye exhibits no chemosis, no discharge, no exudate and no hordeolum. No foreign body present in the left eye. Right conjunctiva is not injected. Right conjunctiva has no hemorrhage. Left conjunctiva is not injected. Left conjunctiva has no hemorrhage. No scleral icterus. Right eye exhibits normal extraocular motion and no nystagmus. Left eye exhibits normal extraocular motion and no nystagmus. Right pupil is round and reactive. Left pupil is round and reactive. Pupils are equal.  Neck: Trachea normal and normal range of motion. Neck supple. No tracheal tenderness, no spinous process tenderness and no muscular tenderness present. No rigidity. No tracheal deviation, no edema, no erythema and normal range of motion present. No thyroid mass and no thyromegaly present.  Cardiovascular: Normal rate, regular rhythm, S1 normal, S2 normal, normal heart sounds and intact distal pulses.  PMI is not displaced.  Exam reveals no gallop and no friction rub.   No murmur heard. Pulmonary/Chest: Effort normal and breath sounds normal. No stridor. No respiratory distress. He has no decreased breath sounds. He has no wheezes. He has no rhonchi. He has no rales.  Negative egophany all fields  Abdominal: Soft. He exhibits no distension.  Musculoskeletal: Normal range of motion. He exhibits no edema or tenderness.       Right shoulder: Normal.       Left shoulder: Normal.       Right elbow: Normal.      Left elbow: Normal.  Right wrist: Normal.       Left wrist: Normal.       Right hip: Normal.       Left hip: Normal.       Right knee: Normal.       Left knee: Normal.       Cervical back: Normal.  Lymphadenopathy:       Head (right side): No submental, no submandibular, no tonsillar, no preauricular, no posterior auricular and no occipital adenopathy present.       Head (left side): No submental, no submandibular, no tonsillar, no preauricular, no  posterior auricular and no occipital adenopathy present.    He has no cervical adenopathy.       Right cervical: No superficial cervical, no deep cervical and no posterior cervical adenopathy present.      Left cervical: No superficial cervical, no deep cervical and no posterior cervical adenopathy present.  Neurological: He is alert and oriented to person, place, and time. He displays no atrophy and no tremor. No cranial nerve deficit or sensory deficit. He exhibits normal muscle tone. He displays no seizure activity. Coordination and gait normal. GCS eye subscore is 4. GCS verbal subscore is 5. GCS motor subscore is 6.  Skin: Skin is warm and intact. No abrasion, no bruising, no burn, no ecchymosis, no laceration, no lesion, no petechiae and no rash noted. He is diaphoretic. No cyanosis or erythema. No pallor. Nails show no clubbing.  Psychiatric: He has a normal mood and affect. His speech is normal and behavior is normal. Judgment and thought content normal. Cognition and memory are normal.  Nursing note and vitals reviewed.   ED Course  Procedures (including critical care time)  Labs Review Labs Reviewed - No data to display  Imaging Review No results found.  Reviewed [place] Controlled Substances website and 1 Rx in past year: 03/13/2015 03/13/2015 HYDROCODON- ACETAMINOPHEN 5- 325 [id number] 15 7 0 0 [id number] LEE Claudie LeachLAURIE WILSON PAC [place] [id number] [place] [place], [place], [place] 06/14/1986 [address] [place], [place] [id number] 04 10.71   Hx dextromethorphan and phenergan syrup for cough with prednisone and zpack [address] PA earlier this year.  But epocrates advised other treatment as risk serotonin syndrome with flexeril.  Augmentin and prilosec interaction avoidance advised.  Patient notified and verbalized understanding of information/instructions and had no further questions at this time.   MDM   1. Acute recurrent frontal sinusitis   2. Otitis media  with effusion, bilateral   3. Acute bronchitis, unspecified organism    Supportive treatment.   No evidence of invasive bacterial infection, non toxic and well hydrated.  This is most likely self limiting viral infection.  I do not see where any further testing or imaging is necessary at this time.   I will suggest supportive care, rest, good hygiene and encourage the patient to take adequate fluids.  The patient is to return to clinic or EMERGENCY ROOM if symptoms worsen or change significantly e.g. ear pain, fever, purulent discharge from ears or bleeding.  Exitcare handout on otitis media with effusion given to patient.  Patient verbalized agreement and understanding of treatment plan.     Suspect Viral illness: no evidence of invasive bacterial infection, non toxic and well hydrated.  This is most likely self limiting viral infection.  I do not see where any further testing or imaging is necessary at this time.   I will suggest supportive care, rest, good hygiene and  encourage the patient to take adequate fluids.  Does not require work excuse. Sudafed 30mg  po q4-6h prn advised max 8 tabs per 24 hours avoid alcohol intake and driving as may cause drowsiness or difficulty sleeping; flonase 1 spray each nostril BID prn, nasal saline 1-2 sprays each nostril prn q2h, tylenol1000mg  po QID prn, tussionex 5ml po bedtime prn cough.  Discussed honey with lemon and salt water gargles for comfort also.  The patient is to return to clinic or EMERGENCY ROOM if symptoms worsen or change significantly e.g. fever, lethargy, SOB, wheezing.  Exitcare handout on viral illness given to patient.  Patient verbalized agreement and understanding of treatment plan.    Bronchitis simple, community acquired, may have started as viral (probably respiratory syncytial, parainfluenza, influenza, or adenovirus), but now evidence of acute purulent bronchitis with resultant bronchial edema and mucus formation.  Viruses are the most common  cause of bronchial inflammation in otherwise healthy adults with acute bronchitis.  The appearance of sputum is not predictive of whether a bacterial infection is present.  Purulent sputum is most often caused by viral infections.  There are a small portion of those caused by non-viral agents being Mycoplamsa pneumonia.  Microscopic examination or C&S of sputum in the healthy adult with acute bronchitis is generally not helpful (usually negative or normal respiratory flora) other considerations being cough from upper respiratory tract infections, sinusitis or allergic syndromes (mild asthma or viral pneumonia).  Differential Diagnosis:  reactive airway disease (asthma, allergic aspergillosis (eosinophilia), chronic bronchitis, respiratory infection (Sinusitis, Common cold, pneumonia), congestive heart failure, reflux esophagitis, bronchogenic tumor, aspiration syndromes and/or exposure irritants/tobacco smoke.  In this case, there is no evidence of any invasive bacterial illness.  Most likely viral etiology so will hold on antibiotic treatment.  Advise supportive care with rest, encourage fluids, good hygiene and watch for any worsening symptoms.  If they were to develop:  come back to the office or go to the emergency room if after hours. Without high fever, severe dyspnea, lack of physical findings or other risk factors, I will hold on a chest radiograph and CBC at this time. I discussed that approximately 50% of patients with acute bronchitis have a cough that lasts up to three weeks, and 25% for over a month.  Tylenol, one to two tablets every four hours as needed for fever or myalgias.   No aspirin.  Patient instructed to follow up in one week or sooner if symptoms worsen. Patient verbalized agreement and understanding of treatment plan.  P2:  hand washing and cover cough  flonase 1 spray each nostril bid/nasal saline 2 sprays each nostril q2h prn congestion and if no relief 48 hours use start doxycycline  100mg  po BID x 10 days Rx given.  No evidence of systemic bacterial infection, non toxic and well hydrated.  I do not see where any further testing or imaging is necessary at this time.   I will suggest supportive care, rest, good hygiene and encourage the patient to take adequate fluids.  The patient is to return to clinic or EMERGENCY ROOM if symptoms worsen or change significantly.  Exitcare handout on sinusitis given to patient.  Patient verbalized agreement and understanding of treatment plan and had no further questions at this time.   P2:  Hand washing and cover cough    Barbaraann Barthel, NP 08/10/15 1451

## 2015-09-14 ENCOUNTER — Emergency Department: Payer: BLUE CROSS/BLUE SHIELD

## 2015-09-14 ENCOUNTER — Encounter: Payer: Self-pay | Admitting: Emergency Medicine

## 2015-09-14 ENCOUNTER — Inpatient Hospital Stay
Admission: EM | Admit: 2015-09-14 | Discharge: 2015-09-16 | DRG: 872 | Disposition: A | Discharge status: Home / Self Care | Payer: BLUE CROSS/BLUE SHIELD | Attending: Internal Medicine | Admitting: Internal Medicine

## 2015-09-14 DIAGNOSIS — A049 Bacterial intestinal infection, unspecified: Secondary | ICD-10-CM | POA: Diagnosis present

## 2015-09-14 DIAGNOSIS — K219 Gastro-esophageal reflux disease without esophagitis: Secondary | ICD-10-CM | POA: Diagnosis present

## 2015-09-14 DIAGNOSIS — R112 Nausea with vomiting, unspecified: Secondary | ICD-10-CM

## 2015-09-14 DIAGNOSIS — F17210 Nicotine dependence, cigarettes, uncomplicated: Secondary | ICD-10-CM | POA: Diagnosis present

## 2015-09-14 DIAGNOSIS — A419 Sepsis, unspecified organism: Secondary | ICD-10-CM | POA: Diagnosis not present

## 2015-09-14 DIAGNOSIS — M62838 Other muscle spasm: Secondary | ICD-10-CM | POA: Diagnosis present

## 2015-09-14 DIAGNOSIS — F419 Anxiety disorder, unspecified: Secondary | ICD-10-CM | POA: Diagnosis present

## 2015-09-14 DIAGNOSIS — G894 Chronic pain syndrome: Secondary | ICD-10-CM | POA: Diagnosis present

## 2015-09-14 DIAGNOSIS — K529 Noninfective gastroenteritis and colitis, unspecified: Secondary | ICD-10-CM | POA: Diagnosis present

## 2015-09-14 DIAGNOSIS — F329 Major depressive disorder, single episode, unspecified: Secondary | ICD-10-CM | POA: Diagnosis present

## 2015-09-14 DIAGNOSIS — A084 Viral intestinal infection, unspecified: Secondary | ICD-10-CM | POA: Diagnosis present

## 2015-09-14 DIAGNOSIS — R52 Pain, unspecified: Secondary | ICD-10-CM

## 2015-09-14 LAB — COMPREHENSIVE METABOLIC PANEL
ALT: 23 U/L (ref 17–63)
ANION GAP: 11 (ref 5–15)
AST: 21 U/L (ref 15–41)
Albumin: 5.4 g/dL — ABNORMAL HIGH (ref 3.5–5.0)
Alkaline Phosphatase: 82 U/L (ref 38–126)
BILIRUBIN TOTAL: 1.2 mg/dL (ref 0.3–1.2)
BUN: 20 mg/dL (ref 6–20)
CHLORIDE: 104 mmol/L (ref 101–111)
CO2: 25 mmol/L (ref 22–32)
Calcium: 9.9 mg/dL (ref 8.9–10.3)
Creatinine, Ser: 0.71 mg/dL (ref 0.61–1.24)
GFR calc Af Amer: >60 mL/min (ref >60)
Glucose, Bld: 122 mg/dL — ABNORMAL HIGH (ref 65–99)
POTASSIUM: 3.9 mmol/L (ref 3.5–5.1)
Sodium: 140 mmol/L (ref 135–145)
TOTAL PROTEIN: 8.3 g/dL — AB (ref 6.5–8.1)

## 2015-09-14 LAB — URINALYSIS COMPLETE WITH MICROSCOPIC (ARMC ONLY)
Bilirubin Urine: NEGATIVE
Glucose, UA: NEGATIVE mg/dL
HGB URINE DIPSTICK: NEGATIVE
LEUKOCYTES UA: NEGATIVE
NITRITE: NEGATIVE
PROTEIN: NEGATIVE mg/dL
SPECIFIC GRAVITY, URINE: 1.028 (ref 1.005–1.030)
pH: 5 (ref 5.0–8.0)

## 2015-09-14 LAB — LIPASE, BLOOD: LIPASE: 19 U/L (ref 11–51)

## 2015-09-14 LAB — CBC
HEMATOCRIT: 50.6 % (ref 40.0–52.0)
HEMOGLOBIN: 16.9 g/dL (ref 13.0–18.0)
MCH: 29 pg (ref 26.0–34.0)
MCHC: 33.4 g/dL (ref 32.0–36.0)
MCV: 86.8 fL (ref 80.0–100.0)
Platelets: 202 10*3/uL (ref 150–440)
RBC: 5.83 MIL/uL (ref 4.40–5.90)
RDW: 14.8 % — ABNORMAL HIGH (ref 11.5–14.5)
WBC: 17.2 10*3/uL — AB (ref 3.8–10.6)

## 2015-09-14 MED ORDER — SODIUM CHLORIDE 0.9 % IV BOLUS (SEPSIS)
1000.0000 mL | Freq: Once | INTRAVENOUS | Status: AC
  Administered 2015-09-14: 1000 mL via INTRAVENOUS

## 2015-09-14 MED ORDER — MORPHINE SULFATE (PF) 4 MG/ML IV SOLN
INTRAVENOUS | Status: AC
  Administered 2015-09-14: 4 mg via INTRAVENOUS
  Filled 2015-09-14: qty 1

## 2015-09-14 MED ORDER — ONDANSETRON 4 MG PO TBDP
4.0000 mg | ORAL_TABLET | Freq: Once | ORAL | Status: AC | PRN
  Administered 2015-09-14: 4 mg via ORAL
  Filled 2015-09-14: qty 1

## 2015-09-14 MED ORDER — MORPHINE SULFATE (PF) 4 MG/ML IV SOLN
4.0000 mg | Freq: Once | INTRAVENOUS | Status: AC
  Administered 2015-09-14: 4 mg via INTRAVENOUS

## 2015-09-14 MED ORDER — DICYCLOMINE HCL 10 MG/ML IM SOLN
20.0000 mg | Freq: Once | INTRAMUSCULAR | Status: AC
  Administered 2015-09-14: 20 mg via INTRAMUSCULAR
  Filled 2015-09-14: qty 2

## 2015-09-14 MED ORDER — ONDANSETRON HCL 4 MG/2ML IJ SOLN
4.0000 mg | Freq: Once | INTRAMUSCULAR | Status: AC
  Administered 2015-09-14: 4 mg via INTRAVENOUS
  Filled 2015-09-14 (×2): qty 2

## 2015-09-14 MED ORDER — KETOROLAC TROMETHAMINE 30 MG/ML IJ SOLN
30.0000 mg | Freq: Once | INTRAMUSCULAR | Status: AC
  Administered 2015-09-14: 30 mg via INTRAVENOUS
  Filled 2015-09-14: qty 1

## 2015-09-14 MED ORDER — ONDANSETRON 4 MG PO TBDP: 4.0000 mg | ORAL_TABLET | Freq: Three times a day (TID) | ORAL | Status: DC | PRN

## 2015-09-14 NOTE — ED Notes (Signed)
Patient transported to CT 

## 2015-09-14 NOTE — ED Notes (Signed)
Vomiting today. Onset of symptoms 0900.  Also c/o low back pain (center / left).

## 2015-09-14 NOTE — ED Provider Notes (Signed)
[facility] Emergency Department Provider Note  ____________________________________________  Time seen: Approximately 6:57 PM  I have reviewed the triage vital signs and the nursing notes.   HISTORY  Chief Complaint Emesis and Back Pain    HPI Jose Zimmerman is a 29 y.o. male with history of chronic pain syndrome in the left arm, depression and anxiety who presents for evaluation of multiple episodes of recurrent nonbloody nonbilious emesis today, gradual onset, constant since onset, severe, no modifying factors. Patient reports that he has had at least 20 episodes of vomiting today. No diarrhea, no fevers or chills. He denies any abdominal pain. He is having some left lumbar back pain today which is atraumatic. No pain or burning with urination, no history of kidney stones. No history of malignancy, no bowel or bladder incontinence, no numbness or weakness, no fevers, no drug use.   Past Medical History  Diagnosis Date  . Depression   . Anxiety   . Vertigo   . GERD (gastroesophageal reflux disease)     Patient Active Problem List   Diagnosis Date Noted  . Depression with anxiety 03/15/2015  . Chronic pain syndrome 03/15/2015  . GERD (gastroesophageal reflux disease) 03/15/2015  . Restless legs syndrome 03/15/2015    Past Surgical History  Procedure Laterality Date  . Ulnar nerve transposition Left 2009    Current Outpatient Rx  Name  Route  Sig  Dispense  Refill  . acetaminophen (TYLENOL) 325 MG tablet   Oral   Take 650 mg by mouth every 6 (six) hours as needed.         . cyclobenzaprine (FLEXERIL) 10 MG tablet      1 Tablet 3 times a day ORAL   90 tablet   0   . naproxen sodium (ALEVE) 220 MG tablet   Oral   Take 440 mg by mouth.         Marland Kitchen omeprazole (PRILOSEC) 20 MG capsule   Oral   Take 20 mg by mouth daily.         . chlorpheniramine-HYDROcodone (TUSSIONEX PENNKINETIC ER) 10-8 MG/5ML SUER   Oral   Take 5 mLs by mouth  at bedtime as needed for cough.   80 mL   0   . doxycycline (VIBRAMYCIN) 100 MG capsule   Oral   Take 1 capsule (100 mg total) by mouth 2 (two) times daily.   20 capsule   0   . fluticasone (FLONASE) 50 MCG/ACT nasal spray   Each Nare   Place 1 spray into both nostrils 2 (two) times daily.   16 g   0   . pseudoephedrine (SUDAFED) 30 MG tablet   Oral   Take 1 tablet (30 mg total) by mouth every 6 (six) hours as needed for congestion (max 8 tabs in 24 hours).   30 tablet   0   . sodium chloride (OCEAN) 0.65 % SOLN nasal spray   Each Nare   Place 2 sprays into both nostrils every 2 (two) hours while awake.      0     Allergies Gabapentin and Keppra  Family History  Problem Relation Age of Onset  . Osteoporosis Mother   . Diabetes Father   . Hypertension Father   . Hyperlipidemia Father     Social History Social History  Substance Use Topics  . Smoking status: Current Every Day Smoker -- 0.50 packs/day for 10 years  . Smokeless tobacco: Former Neurosurgeon  . Alcohol Use:  No    Review of Systems Constitutional: No fever/chills Eyes: No visual changes. ENT: No sore throat. Cardiovascular: Denies chest pain. Respiratory: Denies shortness of breath. Gastrointestinal: No abdominal pain.  + nausea, + vomiting.  No diarrhea.  No constipation. Genitourinary: Negative for dysuria. Musculoskeletal: Positive for back pain. Skin: Negative for rash. Neurological: Negative for headaches, focal weakness or numbness.  10-point ROS otherwise negative.  ____________________________________________   PHYSICAL EXAM:  VITAL SIGNS: ED Triage Vitals  Enc Vitals Group     BP 09/14/15 1809 100/64 mmHg     Pulse Rate 09/14/15 1809 144     Resp 09/14/15 1809 22     Temp 09/14/15 1809 98.2 F (36.8 C)     Temp Source 09/14/15 1809 Oral     SpO2 09/14/15 1809 100 %     Weight 09/14/15 1809 165 lb (74.844 kg)     Height 09/14/15 1809  (1.803 m)     Head Cir --      Peak  Flow --      Pain Score 09/14/15 1812 8     Pain Loc --      Pain Edu? --      Excl. in GC? --     Constitutional: Alert and oriented. Nontoxic appearing and in no acute distress. Eyes: Conjunctivae are normal. PERRL. EOMI. Head: Atraumatic. Nose: No congestion/rhinnorhea. Mouth/Throat: Mucous membranes are dry.  Oropharynx non-erythematous. Neck: No stridor.   Cardiovascular: tachycardic rate, regular rhythm. Grossly normal heart sounds.  Good peripheral circulation. Respiratory: Normal respiratory effort.  No retractions. Lungs CTAB. Gastrointestinal: Soft and nontender. No distention.  No CVA tenderness. Genitourinary: deferred Musculoskeletal: No lower extremity tenderness nor edema.  No joint effusions. Mild tenderness to palpation in the paravertebral muscles associated with the left lumbar spine as well as the right lumbar spine but no midline tenderness. Neurologic:  Normal speech and language. No gross focal neurologic deficits are appreciated. No gait instability. 5 out of 5 in bilateral lower extremities, normal strength of dorsiflexion of the big toes bilaterally. Skin:  Skin is warm, dry and intact. No rash noted. Psychiatric: Mood and affect are normal. Speech and behavior are normal.  ____________________________________________   LABS (all labs ordered are listed, but only abnormal results are displayed)  Labs Reviewed  COMPREHENSIVE METABOLIC PANEL - Abnormal; Notable for the following:    Glucose, Bld 122 (*)    Total Protein 8.3 (*)    Albumin 5.4 (*)    All other components within normal limits  CBC - Abnormal; Notable for the following:    WBC 17.2 (*)    RDW 14.8 (*)    All other components within normal limits  URINALYSIS COMPLETEWITH MICROSCOPIC ([facility] ONLY) - Abnormal; Notable for the following:    Color, Urine YELLOW (*)    APPearance CLEAR (*)    Ketones, ur 1+ (*)    Bacteria, UA RARE (*)    Squamous Epithelial / LPF 0-5 (*)    All other  components within normal limits  LIPASE, BLOOD   ____________________________________________  EKG  ED ECG REPORT I, Gayla Doss, the attending physician, personally viewed and interpreted this ECG.   Date: 09/14/2015  EKG Time: 18:38  Rate: 126  Rhythm: sinus tachycardia  Axis: normal  Intervals:none  ST&T Change: No acute ST elevation. Borderline T-wave abnormality in the inferior leads is likely due to motion artifact.  ____________________________________________  RADIOLOGY  CT abdomen and pelvis IMPRESSION: 1. Multiple borderline enlarged mesenteric  lymph nodes and mild haziness in the root of the small bowel mesentery. These findings are nonspecific, but given the patient's symptoms, this could be related to an enteritis. No obvious changes of the bowel are identified on today's noncontrast CT examination. 2. Normal appendix. 3. Mild atherosclerosis. 4. Additional incidental findings, as above ____________________________________________   PROCEDURES  Procedure(s) performed: None  Critical Care performed: No  ____________________________________________   INITIAL IMPRESSION / ASSESSMENT AND PLAN / ED COURSE  Pertinent labs & imaging results that were available during my care of the patient were reviewed by me and considered in my medical decision making (see chart for details).  Jose Zimmerman is a 29 y.o. male with history of chronic pain syndrome in the left arm, depression and anxiety who presents for evaluation of multiple episodes of recurrent nonbloody nonbilious emesis today. Exam, he is nontoxic appearing and in no acute distress. He was tachycardic with a heart rate of 144 which is improved to the 120s with IV fluids. Abdominal exam is benign. He does have some mild tenderness in the paravertebral muscles of the lumbar spine bilaterally, suspect musculoskeletal pain. Intact neurological exam, no risk factors for cauda equina or epidural abscess.  We'll obtain screening labs, aggressively hydrate him, treat his pain, reassess for need for advanced imaging and reassess for disposition. Urinalysis pending to evaluate for kidney stone or possible pyelonephritis given flank pain.  ----------------------------------------- 10:18 PM on 09/14/2015 -----------------------------------------  Heart rate improved to 119 bpm at this time. The patient is tolerated by mouth intake. CMP is generally unremarkable, as is lipase. Urinalysis is not consistent with infection. CBC was notable for leukocytosis and given the patient's complaint of persistent flank pain, CT abdomen and pelvis was obtained which shows nonspecific lymph node enlargement which could represent enteritis in the setting of the patient's symptoms. We'll continue IV fluids and continue to treat him symptomatically. If He continues to tolerate by mouth intake without vomiting, his pain is cotrolled and his tachycardia also improves, anticipate he could discharged with PCP follow-up. This is the end of my shift. Care transferred to Dr. Derrill KayGoodman pending reassessment and final disposition. ____________________________________________   FINAL CLINICAL IMPRESSION(S) / ED DIAGNOSES  Final diagnoses:  Non-intractable vomiting with nausea, vomiting of unspecified type  Pain      Gayla DossEryka A Julio Zappia, MD 09/14/15 2223

## 2015-09-15 ENCOUNTER — Encounter: Payer: Self-pay | Admitting: Internal Medicine

## 2015-09-15 DIAGNOSIS — K219 Gastro-esophageal reflux disease without esophagitis: Secondary | ICD-10-CM | POA: Diagnosis present

## 2015-09-15 DIAGNOSIS — M62838 Other muscle spasm: Secondary | ICD-10-CM | POA: Diagnosis present

## 2015-09-15 DIAGNOSIS — A049 Bacterial intestinal infection, unspecified: Secondary | ICD-10-CM | POA: Diagnosis present

## 2015-09-15 DIAGNOSIS — K529 Noninfective gastroenteritis and colitis, unspecified: Secondary | ICD-10-CM | POA: Diagnosis present

## 2015-09-15 DIAGNOSIS — A084 Viral intestinal infection, unspecified: Secondary | ICD-10-CM | POA: Diagnosis present

## 2015-09-15 DIAGNOSIS — A419 Sepsis, unspecified organism: Secondary | ICD-10-CM | POA: Diagnosis present

## 2015-09-15 DIAGNOSIS — G894 Chronic pain syndrome: Secondary | ICD-10-CM | POA: Diagnosis present

## 2015-09-15 DIAGNOSIS — F419 Anxiety disorder, unspecified: Secondary | ICD-10-CM | POA: Diagnosis present

## 2015-09-15 DIAGNOSIS — F17210 Nicotine dependence, cigarettes, uncomplicated: Secondary | ICD-10-CM | POA: Diagnosis present

## 2015-09-15 DIAGNOSIS — F329 Major depressive disorder, single episode, unspecified: Secondary | ICD-10-CM | POA: Diagnosis present

## 2015-09-15 LAB — BASIC METABOLIC PANEL
Anion gap: 6 (ref 5–15)
BUN: 15 mg/dL (ref 6–20)
CALCIUM: 7.2 mg/dL — AB (ref 8.9–10.3)
CO2: 24 mmol/L (ref 22–32)
CREATININE: 0.7 mg/dL (ref 0.61–1.24)
Chloride: 108 mmol/L (ref 101–111)
Glucose, Bld: 91 mg/dL (ref 65–99)
Potassium: 3.4 mmol/L — ABNORMAL LOW (ref 3.5–5.1)
SODIUM: 138 mmol/L (ref 135–145)

## 2015-09-15 LAB — CBC
HCT: 36.7 % — ABNORMAL LOW (ref 40.0–52.0)
HEMOGLOBIN: 12.4 g/dL — AB (ref 13.0–18.0)
MCH: 29.4 pg (ref 26.0–34.0)
MCHC: 33.8 g/dL (ref 32.0–36.0)
MCV: 87 fL (ref 80.0–100.0)
PLATELETS: 127 10*3/uL — AB (ref 150–440)
RBC: 4.21 MIL/uL — ABNORMAL LOW (ref 4.40–5.90)
RDW: 14.9 % — AB (ref 11.5–14.5)
WBC: 5.9 10*3/uL (ref 3.8–10.6)

## 2015-09-15 LAB — LACTIC ACID, PLASMA
LACTIC ACID, VENOUS: 1.6 mmol/L (ref 0.5–2.0)
LACTIC ACID, VENOUS: 1.1 mmol/L (ref 0.5–2.0)

## 2015-09-15 MED ORDER — ONDANSETRON HCL 4 MG/2ML IJ SOLN
4.0000 mg | Freq: Once | INTRAMUSCULAR | Status: AC
  Administered 2015-09-15: 4 mg via INTRAVENOUS
  Filled 2015-09-15: qty 2

## 2015-09-15 MED ORDER — ACETAMINOPHEN 650 MG RE SUPP: 650.0000 mg | Freq: Four times a day (QID) | RECTAL | Status: DC | PRN

## 2015-09-15 MED ORDER — MORPHINE SULFATE (PF) 2 MG/ML IV SOLN
2.0000 mg | INTRAVENOUS | Status: DC | PRN
  Administered 2015-09-15 – 2015-09-16 (×6): 2 mg via INTRAVENOUS
  Filled 2015-09-15 (×6): qty 1

## 2015-09-15 MED ORDER — NICOTINE 14 MG/24HR TD PT24
14.0000 mg | MEDICATED_PATCH | Freq: Once | TRANSDERMAL | Status: AC
  Administered 2015-09-15: 14 mg via TRANSDERMAL
  Filled 2015-09-15: qty 1

## 2015-09-15 MED ORDER — GI COCKTAIL ~~LOC~~
30.0000 mL | Freq: Once | ORAL | Status: AC
  Administered 2015-09-15: 30 mL via ORAL
  Filled 2015-09-15: qty 30

## 2015-09-15 MED ORDER — ACETAMINOPHEN 325 MG PO TABS: 650.0000 mg | ORAL_TABLET | Freq: Four times a day (QID) | ORAL | Status: DC | PRN

## 2015-09-15 MED ORDER — PANTOPRAZOLE SODIUM 40 MG PO TBEC
40.0000 mg | DELAYED_RELEASE_TABLET | Freq: Two times a day (BID) | ORAL | Status: DC
  Administered 2015-09-15 – 2015-09-16 (×2): 40 mg via ORAL
  Filled 2015-09-15 (×2): qty 1

## 2015-09-15 MED ORDER — SODIUM CHLORIDE 0.9 % IV BOLUS (SEPSIS)
2000.0000 mL | Freq: Once | INTRAVENOUS | Status: AC
  Administered 2015-09-15: 2000 mL via INTRAVENOUS

## 2015-09-15 MED ORDER — CYCLOBENZAPRINE HCL 10 MG PO TABS
10.0000 mg | ORAL_TABLET | Freq: Three times a day (TID) | ORAL | Status: DC | PRN
  Administered 2015-09-15 – 2015-09-16 (×4): 10 mg via ORAL
  Filled 2015-09-15 (×4): qty 1

## 2015-09-15 MED ORDER — CIPROFLOXACIN IN D5W 400 MG/200ML IV SOLN
400.0000 mg | Freq: Two times a day (BID) | INTRAVENOUS | Status: DC
  Administered 2015-09-15 – 2015-09-16 (×3): 400 mg via INTRAVENOUS
  Filled 2015-09-15 (×4): qty 200

## 2015-09-15 MED ORDER — MORPHINE SULFATE (PF) 4 MG/ML IV SOLN
4.0000 mg | Freq: Once | INTRAVENOUS | Status: AC
  Administered 2015-09-15: 4 mg via INTRAVENOUS
  Filled 2015-09-15: qty 1

## 2015-09-15 MED ORDER — PANTOPRAZOLE SODIUM 40 MG PO TBEC
40.0000 mg | DELAYED_RELEASE_TABLET | Freq: Every day | ORAL | Status: DC
  Administered 2015-09-15: 40 mg via ORAL
  Filled 2015-09-15: qty 1

## 2015-09-15 MED ORDER — ENOXAPARIN SODIUM 40 MG/0.4ML ~~LOC~~ SOLN
40.0000 mg | SUBCUTANEOUS | Status: DC
  Filled 2015-09-15: qty 0.4

## 2015-09-15 MED ORDER — METRONIDAZOLE IN NACL 5-0.79 MG/ML-% IV SOLN
500.0000 mg | Freq: Three times a day (TID) | INTRAVENOUS | Status: DC
  Administered 2015-09-15 – 2015-09-16 (×4): 500 mg via INTRAVENOUS
  Filled 2015-09-15 (×5): qty 100

## 2015-09-15 MED ORDER — SODIUM CHLORIDE 0.9 % IV SOLN
3.0000 g | Freq: Once | INTRAVENOUS | Status: AC
  Administered 2015-09-15: 3 g via INTRAVENOUS
  Filled 2015-09-15: qty 3

## 2015-09-15 MED ORDER — SODIUM CHLORIDE 0.9 % IV BOLUS (SEPSIS)
1000.0000 mL | Freq: Once | INTRAVENOUS | Status: AC
  Administered 2015-09-15: 1000 mL via INTRAVENOUS

## 2015-09-15 MED ORDER — ALBUTEROL SULFATE (2.5 MG/3ML) 0.083% IN NEBU: 2.5000 mg | INHALATION_SOLUTION | RESPIRATORY_TRACT | Status: DC | PRN

## 2015-09-15 MED ORDER — ONDANSETRON HCL 4 MG PO TABS: 4.0000 mg | ORAL_TABLET | Freq: Four times a day (QID) | ORAL | Status: DC | PRN

## 2015-09-15 MED ORDER — NICOTINE 14 MG/24HR TD PT24
MEDICATED_PATCH | TRANSDERMAL | Status: AC
  Administered 2015-09-15: 14 mg via TRANSDERMAL
  Filled 2015-09-15: qty 1

## 2015-09-15 MED ORDER — POTASSIUM CHLORIDE IN NACL 20-0.9 MEQ/L-% IV SOLN
INTRAVENOUS | Status: DC
  Administered 2015-09-15 – 2015-09-16 (×2): via INTRAVENOUS
  Filled 2015-09-15 (×6): qty 1000

## 2015-09-15 MED ORDER — ONDANSETRON HCL 4 MG/2ML IJ SOLN
4.0000 mg | Freq: Four times a day (QID) | INTRAMUSCULAR | Status: DC | PRN
  Administered 2015-09-15: 4 mg via INTRAVENOUS
  Filled 2015-09-15: qty 2

## 2015-09-15 MED ORDER — NICOTINE 14 MG/24HR TD PT24
14.0000 mg | MEDICATED_PATCH | Freq: Every day | TRANSDERMAL | Status: DC
  Administered 2015-09-15: 14 mg via TRANSDERMAL

## 2015-09-15 MED ORDER — HYDROCODONE-ACETAMINOPHEN 5-325 MG PO TABS
1.0000 | ORAL_TABLET | ORAL | Status: DC | PRN
  Administered 2015-09-15 – 2015-09-16 (×3): 2 via ORAL
  Filled 2015-09-15 (×3): qty 2

## 2015-09-15 NOTE — H&P (Addendum)
[facility] - [place] at [facility]   PATIENT NAME: Jose Zimmerman    MR#:  [id number]  DATE OF BIRTH:  [birth year]  DATE OF ADMISSION:  09/14/2015  PRIMARY CARE PHYSICIAN: Gabriel Cirri, NP   REQUESTING/REFERRING PHYSICIAN: Dr. Darnelle Catalan  CHIEF COMPLAINT:   Chief Complaint  Patient presents with  . Emesis  . Back Pain    HISTORY OF PRESENT ILLNESS:  [name]  is a 29 y.o. male with a known history of GERD, anxiety presents to the emergency room complaining of acute onset of vomiting and lower abdominal pain and back pain. Symptoms started 9 AM yesterday with lower abdominal pain and back pain followed by vomiting. Patient threw up close to 20 times before presenting to the emergency room. He has not had any vomiting for the last 10 hours here in the emergency room but continues to have low back pain. His white count is elevated at 17,000 and continues to be tachycardic into the 130s in spite of receiving 3 L of normal saline. Patient has just started making urine which is dark and only close to 100 ML. No blood in the vomiting. No diarrhea. Presently his main complaint is back pain and the abdominal pain has resolved. CT scan of the abdomen showed antritis and mesenteric lymphadenopathy.  PAST MEDICAL HISTORY:   Past Medical History  Diagnosis Date  . Depression   . Anxiety   . Vertigo   . GERD (gastroesophageal reflux disease)     PAST SURGICAL HISTORY:   Past Surgical History  Procedure Laterality Date  . Ulnar nerve transposition Left 2009    SOCIAL HISTORY:   Social History  Substance Use Topics  . Smoking status: Current Every Day Smoker -- 0.50 packs/day for 10 years  . Smokeless tobacco: Former Neurosurgeon  . Alcohol Use: No    FAMILY HISTORY:   Family History  Problem Relation Age of Onset  . Osteoporosis Mother   . Diabetes Father   . Hypertension Father   . Hyperlipidemia Father     DRUG ALLERGIES:   Allergies   Allergen Reactions  . Gabapentin Nausea And Vomiting    Drowsiness, confusion  . Keppra [Levetiracetam] Nausea And Vomiting    Drowsiness, confusion    REVIEW OF SYSTEMS:   Review of Systems  Constitutional: Positive for malaise/fatigue. Negative for fever, chills and weight loss.  HENT: Negative for hearing loss and nosebleeds.   Eyes: Negative for blurred vision, double vision and pain.  Respiratory: Negative for cough, hemoptysis, sputum production, shortness of breath and wheezing.   Cardiovascular: Negative for chest pain, palpitations, orthopnea and leg swelling.  Gastrointestinal: Positive for nausea and vomiting. Negative for abdominal pain, diarrhea and constipation.  Genitourinary: Negative for dysuria and hematuria.  Musculoskeletal: Positive for back pain. Negative for myalgias and falls.  Skin: Negative for rash.  Neurological: Positive for weakness. Negative for dizziness, tremors, sensory change, speech change, focal weakness, seizures and headaches.  Endo/Heme/Allergies: Does not bruise/bleed easily.  Psychiatric/Behavioral: Negative for depression and memory loss. The patient is not nervous/anxious.     MEDICATIONS AT HOME:   Prior to Admission medications   Medication Sig Start Date End Date Taking? Authorizing Provider  acetaminophen (TYLENOL) 325 MG tablet Take 650 mg by mouth every 6 (six) hours as needed.   Yes Historical Provider, MD  cyclobenzaprine (FLEXERIL) 10 MG tablet 1 Tablet 3 times a day ORAL 08/13/15  Yes Gabriel Cirri, NP  naproxen sodium (ALEVE) 220 MG tablet  Take 440 mg by mouth.   Yes Historical Provider, MD  omeprazole (PRILOSEC) 20 MG capsule Take 20 mg by mouth daily.   Yes Historical Provider, MD  chlorpheniramine-HYDROcodone (TUSSIONEX PENNKINETIC ER) 10-8 MG/5ML SUER Take 5 mLs by mouth at bedtime as needed for cough. 08/10/15   Barbaraann Barthel, NP  doxycycline (VIBRAMYCIN) 100 MG capsule Take 1 capsule (100 mg total) by mouth 2 (two)  times daily. 08/10/15   Barbaraann Barthel, NP  fluticasone (FLONASE) 50 MCG/ACT nasal spray Place 1 spray into both nostrils 2 (two) times daily. 08/10/15   Barbaraann Barthel, NP  ondansetron (ZOFRAN ODT) 4 MG disintegrating tablet Take 1 tablet (4 mg total) by mouth every 8 (eight) hours as needed for nausea or vomiting. 09/14/15   Gayla Doss, MD  pseudoephedrine (SUDAFED) 30 MG tablet Take 1 tablet (30 mg total) by mouth every 6 (six) hours as needed for congestion (max 8 tabs in 24 hours). 08/10/15   Barbaraann Barthel, NP  sodium chloride (OCEAN) 0.65 % SOLN nasal spray Place 2 sprays into both nostrils every 2 (two) hours while awake. 08/10/15   Barbaraann Barthel, NP      VITAL SIGNS:  Blood pressure 118/79, pulse 133, temperature 98.2 F (36.8 C), temperature source Oral, resp. rate 31, height  (1.803 m), weight 74.844 kg (165 lb), SpO2 100 %.  PHYSICAL EXAMINATION:  Physical Exam  GENERAL:  29 y.o.-year-old patient lying in the bed with no acute distress.  EYES: Pupils equal, round, reactive to light and accommodation. No scleral icterus. Extraocular muscles intact.  HEENT: Head atraumatic, normocephalic. Oropharynx and nasopharynx clear. No oropharyngeal erythema, moist oral dry. NECK:  Supple, no jugular venous distention. No thyroid enlargement, no tenderness.  LUNGS: Normal breath sounds bilaterally, no wheezing, rales, rhonchi. No use of accessory muscles of respiration.  CARDIOVASCULAR: S1, S2 normal. No murmurs, rubs, or gallops.  ABDOMEN: Soft, nondistended. Bowel sounds present. No organomegaly or mass. Lower abd tender EXTREMITIES: No pedal edema, cyanosis, or clubbing. + 2 pedal & radial pulses b/l.   NEUROLOGIC: Cranial nerves II through XII are intact. No focal Motor or sensory deficits appreciated b/l PSYCHIATRIC: The patient is alert and oriented x 3. Good affect.  SKIN: No obvious rash, lesion, or ulcer.   LABORATORY PANEL:   CBC  Recent Labs Lab  09/14/15 1821  WBC 17.2*  HGB 16.9  HCT 50.6  PLT 202   ------------------------------------------------------------------------------------------------------------------  Chemistries   Recent Labs Lab 09/14/15 1821  NA 140  K 3.9  CL 104  CO2 25  GLUCOSE 122*  BUN 20  CREATININE 0.71  CALCIUM 9.9  AST 21  ALT 23  ALKPHOS 82  BILITOT 1.2   ------------------------------------------------------------------------------------------------------------------  Cardiac Enzymes No results for input(s): TROPONINI in the last 168 hours. ------------------------------------------------------------------------------------------------------------------  RADIOLOGY:  Ct Renal Stone Study  09/14/2015  CLINICAL DATA:  29 year old male with history of vomiting today since 9 a.m. Low back pain. EXAM: CT ABDOMEN AND PELVIS WITHOUT CONTRAST TECHNIQUE: Multidetector CT imaging of the abdomen and pelvis was performed following the standard protocol without IV contrast. COMPARISON:  CT the abdomen and pelvis 08/02/2011. FINDINGS: Lower chest: 4 mm subpleural nodule in the periphery of the right lower lobe (image 12 of series 4), and a 5 mm subpleural nodule in the periphery of the left lower lobe (image 13 of series 4) are both unchanged compared to prior study 08/02/2011, considered benign, likely subpleural lymph nodes. Hepatobiliary: No definite cystic  or solid hepatic lesions are identified on today's noncontrast CT examination. Unenhanced appearance of the gallbladder is normal. Pancreas: No pancreatic mass or peripancreatic inflammatory changes are identified on today's noncontrast CT examination. Spleen: Unremarkable. Adrenals/Urinary Tract: Unenhanced appearance of the kidneys and bilateral adrenal glands is normal. No urinary tract calculi identified. No hydroureteronephrosis. Urinary bladder is normal in appearance. Stomach/Bowel: Normal appearance of the stomach. No pathologic dilatation of  small bowel or colon. Normal appendix. Vascular/Lymphatic: Mild atherosclerotic calcifications in the abdominal and pelvic vasculature, without evidence of aneurysm. There multiple prominent borderline enlarged mesenteric lymph nodes throughout the root of the small bowel mesentery measuring up to 9 mm. No definite lymphadenopathy noted elsewhere in the abdomen or pelvis on today's noncontrast CT examination. Reproductive: Prostate gland and seminal vesicles are unremarkable in appearance. Other: No significant volume of ascites. No pneumoperitoneum. Diffuse haziness in the root of the small bowel mesentery adjacent to the multiple borderline enlarged lymph nodes. Musculoskeletal: There are no aggressive appearing lytic or blastic lesions noted in the visualized portions of the skeleton. IMPRESSION: 1. Multiple borderline enlarged mesenteric lymph nodes and mild haziness in the root of the small bowel mesentery. These findings are nonspecific, but given the patient's symptoms, this could be related to an enteritis. No obvious changes of the bowel are identified on today's noncontrast CT examination. 2. Normal appendix. 3. Mild atherosclerosis. 4. Additional incidental findings, as above Electronically Signed   By: Trudie Reedaniel  Entrikin M.D.   On: 09/14/2015 22:02     IMPRESSION AND PLAN:   * Entritis with sepsis - Tachycardia and leucocytosis We will bolus 2 L of normal saline stat. Check lactic acid. Start IV ciprofloxacin and Flagyl. IV and oral pain medications. Likely viral/bacterial. If symptoms do not improve or are prolonged will need GI consult inpatient. Or can have him follow-up as OP. Family history of crohns disease. Discussed with patient and his father at bedside regarding CT scan findings and plan. His back pain is likely radiation from the entritis. Nothing else acute found on the CAT scan of abdomen.  * Chronic pain and muscle spasms Continue Flexeril  * DVT prophylaxis with  Lovenox   All the records are reviewed and case discussed with ED provider. Management plans discussed with the patient, family and they are in agreement.  CODE STATUS: FULL  TOTAL TIME TAKING CARE OF THIS PATIENT: 40 minutes.    Milagros LollSudini, Cleatus Goodin R M.D on 09/15/2015 at 4:19 AM  Between 7am to 6pm - Pager - [phone]  After 6pm go to [email or web address] - password [email or web address]  [facility]  Office  [phone]  CC: Primary care physician; Gabriel Cirriheryl Wicker, NP   Note: This dictation was prepared with Dragon dictation along with smaller phrase technology. Any transcriptional errors that result from this process are unintentional.

## 2015-09-15 NOTE — Progress Notes (Signed)
Patient c/o lower back pain x2 relieved well with prn morphine 2 mg IVP Patient c/o muscle spasms relieved well with prn Flexeral x2 Diet advanced from clear to soft with no c/o nausea or vomiting Continues on IV ABX and NS w 20 mEq/l KCI Possible d/c tomorrow

## 2015-09-15 NOTE — Plan of Care (Signed)
Problem: Pain Managment: Goal: General experience of comfort will improve Outcome: Progressing Pt started on morphine in the ED for back pain  Problem: Fluid Volume: Goal: Ability to maintain a balanced intake and output will improve Outcome: Progressing 5L bolus given in ED  Problem: Nutrition: Goal: Adequate nutrition will be maintained Outcome: Progressing Sipping on clear liquid

## 2015-09-16 LAB — BASIC METABOLIC PANEL
ANION GAP: 3 — AB (ref 5–15)
BUN: 7 mg/dL (ref 6–20)
CALCIUM: 8.2 mg/dL — AB (ref 8.9–10.3)
CO2: 26 mmol/L (ref 22–32)
CREATININE: 0.76 mg/dL (ref 0.61–1.24)
Chloride: 110 mmol/L (ref 101–111)
GLUCOSE: 116 mg/dL — AB (ref 65–99)
Potassium: 3.6 mmol/L (ref 3.5–5.1)
Sodium: 139 mmol/L (ref 135–145)

## 2015-09-16 MED ORDER — PANTOPRAZOLE SODIUM 40 MG PO TBEC: 40.0000 mg | DELAYED_RELEASE_TABLET | Freq: Two times a day (BID) | ORAL | Status: DC

## 2015-09-16 MED ORDER — ONDANSETRON HCL 4 MG PO TABS: 4.0000 mg | ORAL_TABLET | Freq: Four times a day (QID) | ORAL | Status: DC | PRN

## 2015-09-16 MED ORDER — CIPROFLOXACIN HCL 500 MG PO TABS
500.0000 mg | ORAL_TABLET | Freq: Two times a day (BID) | ORAL | Status: DC
Start: 2015-09-16 — End: 2016-04-14

## 2015-09-16 MED ORDER — METRONIDAZOLE 250 MG PO TABS: 250.0000 mg | ORAL_TABLET | Freq: Three times a day (TID) | ORAL | Status: DC

## 2015-09-16 MED ORDER — NICOTINE 14 MG/24HR TD PT24: 14.0000 mg | MEDICATED_PATCH | Freq: Every day | TRANSDERMAL | Status: DC

## 2015-09-16 MED ORDER — HYDROCODONE-ACETAMINOPHEN 5-325 MG PO TABS: 1.0000 | ORAL_TABLET | Freq: Four times a day (QID) | ORAL | Status: DC | PRN

## 2015-09-16 NOTE — Discharge Summary (Signed)
[facility] -  at [facility]   PATIENT NAME: Jose FormBradley Zimmerman    MR#:  [id number]  DATE OF BIRTH:  [birth year]  DATE OF ADMISSION:  09/14/2015 ADMITTING PHYSICIAN: Milagros LollSrikar Sudini, MD  DATE OF DISCHARGE: 09/16/2015  PRIMARY CARE PHYSICIAN: Gabriel Cirriheryl Wicker, NP    ADMISSION DIAGNOSIS:  Pain [R52] Non-intractable vomiting with nausea, vomiting of unspecified type [R11.2]  DISCHARGE DIAGNOSIS:  Active Problems:   Enteritis   SECONDARY DIAGNOSIS:   Past Medical History  Diagnosis Date  . Depression   . Anxiety   . Vertigo   . GERD (gastroesophageal reflux disease)     HOSPITAL COURSE:   * Entritis with sepsis - Tachycardia and leucocytosis on presentation. Responded to bolus 2 L of normal saline stat. given IV ciprofloxacin and Flagyl. IV and oral pain medications. Likely viral/bacterial. His back pain is likely radiation from the entritis. Nothing else acute found on the CAT scan of abdomen. Felt much better and tolerated slow upgrade in diet.  Advised to stop taking NSAIDS and given PPI BID on d/c with ABx.  * Chronic pain and muscle spasms Continue Flexeril  * DVT prophylaxis with Lovenox  DISCHARGE CONDITIONS:   Stable.  CONSULTS OBTAINED:     DRUG ALLERGIES:   Allergies  Allergen Reactions  . Gabapentin Nausea And Vomiting    Drowsiness, confusion  . Keppra [Levetiracetam] Nausea And Vomiting    Drowsiness, confusion    DISCHARGE MEDICATIONS:   Current Discharge Medication List    START taking these medications   Details  ciprofloxacin (CIPRO) 500 MG tablet Take 1 tablet (500 mg total) by mouth 2 (two) times daily. Qty: 14 tablet, Refills: 0    HYDROcodone-acetaminophen (NORCO/VICODIN) 5-325 MG tablet Take 1 tablet by mouth every 6 (six) hours as needed for moderate pain. Qty: 24 tablet, Refills: 0    metroNIDAZOLE (FLAGYL) 250 MG tablet Take 1 tablet (250 mg total) by mouth 3 (three) times daily. Qty: 21 tablet,  Refills: 0    nicotine (NICODERM CQ - DOSED IN MG/24 HOURS) 14 mg/24hr patch Place 1 patch (14 mg total) onto the skin daily. Qty: 28 patch, Refills: 0    ondansetron (ZOFRAN ODT) 4 MG disintegrating tablet Take 1 tablet (4 mg total) by mouth every 8 (eight) hours as needed for nausea or vomiting. Qty: 12 tablet, Refills: 0    ondansetron (ZOFRAN) 4 MG tablet Take 1 tablet (4 mg total) by mouth every 6 (six) hours as needed for nausea. Qty: 20 tablet, Refills: 0    pantoprazole (PROTONIX) 40 MG tablet Take 1 tablet (40 mg total) by mouth 2 (two) times daily. Qty: 60 tablet, Refills: 0      CONTINUE these medications which have NOT CHANGED   Details  acetaminophen (TYLENOL) 325 MG tablet Take 650 mg by mouth every 6 (six) hours as needed.    cyclobenzaprine (FLEXERIL) 10 MG tablet 1 Tablet 3 times a day ORAL Qty: 90 tablet, Refills: 0    chlorpheniramine-HYDROcodone (TUSSIONEX PENNKINETIC ER) 10-8 MG/5ML SUER Take 5 mLs by mouth at bedtime as needed for cough. Qty: 80 mL, Refills: 0    fluticasone (FLONASE) 50 MCG/ACT nasal spray Place 1 spray into both nostrils 2 (two) times daily. Qty: 16 g, Refills: 0    pseudoephedrine (SUDAFED) 30 MG tablet Take 1 tablet (30 mg total) by mouth every 6 (six) hours as needed for congestion (max 8 tabs in 24 hours). Qty: 30 tablet, Refills: 0    sodium  chloride (OCEAN) 0.65 % SOLN nasal spray Place 2 sprays into both nostrils every 2 (two) hours while awake. Refills: 0      STOP taking these medications     naproxen sodium (ALEVE) 220 MG tablet      omeprazole (PRILOSEC) 20 MG capsule      doxycycline (VIBRAMYCIN) 100 MG capsule          DISCHARGE INSTRUCTIONS:    Follow with PMD in 2 weeks.  If you experience worsening of your admission symptoms, develop shortness of breath, life threatening emergency, suicidal or homicidal thoughts you must seek medical attention immediately by calling 911 or calling your MD immediately  if  symptoms less severe.  You Must read complete instructions/literature along with all the possible adverse reactions/side effects for all the Medicines you take and that have been prescribed to you. Take any new Medicines after you have completely understood and accept all the possible adverse reactions/side effects.   Please note  You were cared for by a hospitalist during your hospital stay. If you have any questions about your discharge medications or the care you received while you were in the hospital after you are discharged, you can call the unit and asked to speak with the hospitalist on call if the hospitalist that took care of you is not available. Once you are discharged, your primary care physician will handle any further medical issues. Please note that NO REFILLS for any discharge medications will be authorized once you are discharged, as it is imperative that you return to your primary care physician (or establish a relationship with a primary care physician if you do not have one) for your aftercare needs so that they can reassess your need for medications and monitor your lab values.    Today   CHIEF COMPLAINT:   Chief Complaint  Patient presents with  . Emesis  . Back Pain    HISTORY OF PRESENT ILLNESS:  Jose Zimmerman  is a 30 y.o. male with a known history of GERD, anxiety presents to the emergency room complaining of acute onset of vomiting and lower abdominal pain and back pain. Symptoms started 9 AM yesterday with lower abdominal pain and back pain followed by vomiting. Patient threw up close to 20 times before presenting to the emergency room. He has not had any vomiting for the last 10 hours here in the emergency room but continues to have low back pain. His white count is elevated at 17,000 and continues to be tachycardic into the 130s in spite of receiving 3 L of normal saline. Patient has just started making urine which is dark and only close to 100 ML. No blood in  the vomiting. No diarrhea. Presently his main complaint is back pain and the abdominal pain has resolved. CT scan of the abdomen showed antritis and mesenteric lymphadenopathy.   VITAL SIGNS:  Blood pressure 116/80, pulse 84, temperature 98 F (36.7 C), temperature source Oral, resp. rate 18, height 5\' 11"  (1.803 m), weight 73.029 kg (161 lb), SpO2 100 %.  I/O:   Intake/Output Summary (Last 24 hours) at 09/16/15 1005 Last data filed at 09/16/15 0737  Gross per 24 hour  Intake 4677.5 ml  Output   2400 ml  Net 2277.5 ml    PHYSICAL EXAMINATION:  GENERAL:  30 y.o.-year-old patient lying in the bed with no acute distress.  EYES: Pupils equal, round, reactive to light and accommodation. No scleral icterus. Extraocular muscles intact.  HEENT: Head atraumatic, normocephalic.  Oropharynx and nasopharynx clear.  NECK:  Supple, no jugular venous distention. No thyroid enlargement, no tenderness.  LUNGS: Normal breath sounds bilaterally, no wheezing, rales,rhonchi or crepitation. No use of accessory muscles of respiration.  CARDIOVASCULAR: S1, S2 normal. No murmurs, rubs, or gallops.  ABDOMEN: Soft, mild tender, non-distended. Bowel sounds present. No organomegaly or mass.  EXTREMITIES: No pedal edema, cyanosis, or clubbing.  NEUROLOGIC: Cranial nerves II through XII are intact. Muscle strength 5/5 in all extremities. Sensation intact. Gait not checked.  PSYCHIATRIC: The patient is alert and oriented x 3.  SKIN: No obvious rash, lesion, or ulcer.   DATA REVIEW:   CBC  Recent Labs Lab 09/15/15 0611  WBC 5.9  HGB 12.4*  HCT 36.7*  PLT 127*    Chemistries   Recent Labs Lab 09/14/15 1821  09/16/15 0502  NA 140  < > 139  K 3.9  < > 3.6  CL 104  < > 110  CO2 25  < > 26  GLUCOSE 122*  < > 116*  BUN 20  < > 7  CREATININE 0.71  < > 0.76  CALCIUM 9.9  < > 8.2*  AST 21  --   --   ALT 23  --   --   ALKPHOS 82  --   --   BILITOT 1.2  --   --   < > = values in this interval not  displayed.  Cardiac Enzymes No results for input(s): TROPONINI in the last 168 hours.  Microbiology Results  No results found for this or any previous visit.  RADIOLOGY:  Ct Renal Stone Study  09/14/2015  CLINICAL DATA:  30 year old male with history of vomiting today since 9 a.m. Low back pain. EXAM: CT ABDOMEN AND PELVIS WITHOUT CONTRAST TECHNIQUE: Multidetector CT imaging of the abdomen and pelvis was performed following the standard protocol without IV contrast. COMPARISON:  CT the abdomen and pelvis 08/02/2011. FINDINGS: Lower chest: 4 mm subpleural nodule in the periphery of the right lower lobe (image 12 of series 4), and a 5 mm subpleural nodule in the periphery of the left lower lobe (image 13 of series 4) are both unchanged compared to prior study 08/02/2011, considered benign, likely subpleural lymph nodes. Hepatobiliary: No definite cystic or solid hepatic lesions are identified on today's noncontrast CT examination. Unenhanced appearance of the gallbladder is normal. Pancreas: No pancreatic mass or peripancreatic inflammatory changes are identified on today's noncontrast CT examination. Spleen: Unremarkable. Adrenals/Urinary Tract: Unenhanced appearance of the kidneys and bilateral adrenal glands is normal. No urinary tract calculi identified. No hydroureteronephrosis. Urinary bladder is normal in appearance. Stomach/Bowel: Normal appearance of the stomach. No pathologic dilatation of small bowel or colon. Normal appendix. Vascular/Lymphatic: Mild atherosclerotic calcifications in the abdominal and pelvic vasculature, without evidence of aneurysm. There multiple prominent borderline enlarged mesenteric lymph nodes throughout the root of the small bowel mesentery measuring up to 9 mm. No definite lymphadenopathy noted elsewhere in the abdomen or pelvis on today's noncontrast CT examination. Reproductive: Prostate gland and seminal vesicles are unremarkable in appearance. Other: No significant  volume of ascites. No pneumoperitoneum. Diffuse haziness in the root of the small bowel mesentery adjacent to the multiple borderline enlarged lymph nodes. Musculoskeletal: There are no aggressive appearing lytic or blastic lesions noted in the visualized portions of the skeleton. IMPRESSION: 1. Multiple borderline enlarged mesenteric lymph nodes and mild haziness in the root of the small bowel mesentery. These findings are nonspecific, but given the patient's symptoms, this could  be related to an enteritis. No obvious changes of the bowel are identified on today's noncontrast CT examination. 2. Normal appendix. 3. Mild atherosclerosis. 4. Additional incidental findings, as above Electronically Signed   By: Trudie Reed M.D.   On: 09/14/2015 22:02    EKG:   Orders placed or performed during the hospital encounter of 05/05/15  . ED EKG  . ED EKG  . EKG      Management plans discussed with the patient, family and they are in agreement.  CODE STATUS:     Code Status Orders        Start     Ordered   09/15/15 0435  Full code   Continuous     09/15/15 0434      TOTAL TIME TAKING CARE OF THIS PATIENT: 35  minutes.    Altamese Dilling M.D on 09/16/2015 at 10:05 AM  Between 7am to 6pm - Pager - [phone]  After 6pm go to [email or web address] - password [email or web address]  [facility]  Office  [phone]  CC: Primary care physician; Gabriel Cirri, NP   Note: This dictation was prepared with Dragon dictation along with smaller phrase technology. Any transcriptional errors that result from this process are unintentional.

## 2015-09-16 NOTE — Progress Notes (Signed)
[facility] [place] [facility]   PATIENT NAME: Jose Zimmerman    MR#:  [id number]  DATE OF BIRTH:  Feb 06, 1986  SUBJECTIVE:  CHIEF COMPLAINT:   Chief Complaint  Patient presents with  . Emesis  . Back Pain    Came with vomiting and Abdominal pain- CT suggest Enteritis, no more nausea with IV meds, asking to try liquids diet.  REVIEW OF SYSTEMS:  CONSTITUTIONAL: No fever, fatigue or weakness.  EYES: No blurred or double vision.  EARS, NOSE, AND THROAT: No tinnitus or ear pain.  RESPIRATORY: No cough, shortness of breath, wheezing or hemoptysis.  CARDIOVASCULAR: No chest pain, orthopnea, edema.  GASTROINTESTINAL: No nausea, vomiting, diarrhea , he have abdominal pain.  GENITOURINARY: No dysuria, hematuria.  ENDOCRINE: No polyuria, nocturia,  HEMATOLOGY: No anemia, easy bruising or bleeding SKIN: No rash or lesion. MUSCULOSKELETAL: No joint pain or arthritis.   NEUROLOGIC: No tingling, numbness, weakness.  PSYCHIATRY: No anxiety or depression.   ROS  DRUG ALLERGIES:   Allergies  Allergen Reactions  . Gabapentin Nausea And Vomiting    Drowsiness, confusion  . Keppra [Levetiracetam] Nausea And Vomiting    Drowsiness, confusion    VITALS:  Blood pressure 116/80, pulse 84, temperature 98 F (36.7 C), temperature source Oral, resp. rate 18, height 5\' 11"  (1.803 m), weight 73.029 kg (161 lb), SpO2 100 %.  PHYSICAL EXAMINATION:  GENERAL:  30 y.o.-year-old patient lying in the bed with no acute distress.  EYES: Pupils equal, round, reactive to light and accommodation. No scleral icterus. Extraocular muscles intact.  HEENT: Head atraumatic, normocephalic. Oropharynx and nasopharynx clear.  NECK:  Supple, no jugular venous distention. No thyroid enlargement, no tenderness.  LUNGS: Normal breath sounds bilaterally, no wheezing, rales,rhonchi or crepitation. No use of accessory muscles of respiration.  CARDIOVASCULAR: S1, S2 normal. No murmurs,  rubs, or gallops.  ABDOMEN: Soft, Mild tender, nondistended. Bowel sounds present. No organomegaly or mass.  EXTREMITIES: No pedal edema, cyanosis, or clubbing.  NEUROLOGIC: Cranial nerves II through XII are intact. Muscle strength 5/5 in all extremities. Sensation intact. Gait not checked.  PSYCHIATRIC: The patient is alert and oriented x 3.  SKIN: No obvious rash, lesion, or ulcer.   Physical Exam LABORATORY PANEL:   CBC  Recent Labs Lab 09/15/15 0611  WBC 5.9  HGB 12.4*  HCT 36.7*  PLT 127*   ------------------------------------------------------------------------------------------------------------------  Chemistries   Recent Labs Lab 09/14/15 1821  09/16/15 0502  NA 140  < > 139  K 3.9  < > 3.6  CL 104  < > 110  CO2 25  < > 26  GLUCOSE 122*  < > 116*  BUN 20  < > 7  CREATININE 0.71  < > 0.76  CALCIUM 9.9  < > 8.2*  AST 21  --   --   ALT 23  --   --   ALKPHOS 82  --   --   BILITOT 1.2  --   --   < > = values in this interval not displayed. ------------------------------------------------------------------------------------------------------------------  Cardiac Enzymes No results for input(s): TROPONINI in the last 168 hours. ------------------------------------------------------------------------------------------------------------------  RADIOLOGY:  Ct Renal Stone Study  09/14/2015  CLINICAL DATA:  30 year old male with history of vomiting today since 9 a.m. Low back pain. EXAM: CT ABDOMEN AND PELVIS WITHOUT CONTRAST TECHNIQUE: Multidetector CT imaging of the abdomen and pelvis was performed following the standard protocol without IV contrast. COMPARISON:  CT the abdomen and pelvis 08/02/2011. FINDINGS: Lower  chest: 4 mm subpleural nodule in the periphery of the right lower lobe (image 12 of series 4), and a 5 mm subpleural nodule in the periphery of the left lower lobe (image 13 of series 4) are both unchanged compared to prior study 08/02/2011, considered  benign, likely subpleural lymph nodes. Hepatobiliary: No definite cystic or solid hepatic lesions are identified on today's noncontrast CT examination. Unenhanced appearance of the gallbladder is normal. Pancreas: No pancreatic mass or peripancreatic inflammatory changes are identified on today's noncontrast CT examination. Spleen: Unremarkable. Adrenals/Urinary Tract: Unenhanced appearance of the kidneys and bilateral adrenal glands is normal. No urinary tract calculi identified. No hydroureteronephrosis. Urinary bladder is normal in appearance. Stomach/Bowel: Normal appearance of the stomach. No pathologic dilatation of small bowel or colon. Normal appendix. Vascular/Lymphatic: Mild atherosclerotic calcifications in the abdominal and pelvic vasculature, without evidence of aneurysm. There multiple prominent borderline enlarged mesenteric lymph nodes throughout the root of the small bowel mesentery measuring up to 9 mm. No definite lymphadenopathy noted elsewhere in the abdomen or pelvis on today's noncontrast CT examination. Reproductive: Prostate gland and seminal vesicles are unremarkable in appearance. Other: No significant volume of ascites. No pneumoperitoneum. Diffuse haziness in the root of the small bowel mesentery adjacent to the multiple borderline enlarged lymph nodes. Musculoskeletal: There are no aggressive appearing lytic or blastic lesions noted in the visualized portions of the skeleton. IMPRESSION: 1. Multiple borderline enlarged mesenteric lymph nodes and mild haziness in the root of the small bowel mesentery. These findings are nonspecific, but given the patient's symptoms, this could be related to an enteritis. No obvious changes of the bowel are identified on today's noncontrast CT examination. 2. Normal appendix. 3. Mild atherosclerosis. 4. Additional incidental findings, as above Electronically Signed   By: Trudie Reed M.D.   On: 09/14/2015 22:02    ASSESSMENT AND PLAN:   Active  Problems:   Enteritis   * Entritis with sepsis - Tachycardia and leucocytosis Responded to bolus 2 L of normal saline stat.  Started IV ciprofloxacin and Flagyl. IV and oral pain medications. Likely viral/bacterial.  Improved, start on liquid diet and upgrade. His back pain is likely radiation from the entritis. Nothing else acute found on the CAT scan of abdomen.  * Chronic pain and muscle spasms Continue Flexeril  * DVT prophylaxis with Lovenox   All the records are reviewed and case discussed with Care Management/Social Workerr. Management plans discussed with the patient, family and they are in agreement.  CODE STATUS: fuLL  TOTAL TIME TAKING CARE OF THIS PATIENT: 35 minutes.     POSSIBLE D/C IN 1-2 DAYS, DEPENDING ON CLINICAL CONDITION.   Altamese Dilling M.D on 09/16/2015   Between 7am to 6pm - Pager - [phone]  After 6pm go to [email or web address] - password [email or web address]  [facility]  Office  [phone]  CC: Primary care physician; Gabriel Cirri, NP  Note: This dictation was prepared with Dragon dictation along with smaller phrase technology. Any transcriptional errors that result from this process are unintentional.

## 2015-09-16 NOTE — Progress Notes (Signed)
Pt given prescriptions x 5, pt given d/c instructions r/t activity, diet, follow up care and medications, voiced understanding, pt d/c home via wheelchair escorted by staff and family

## 2016-01-22 ENCOUNTER — Emergency Department: Payer: BLUE CROSS/BLUE SHIELD

## 2016-01-22 ENCOUNTER — Encounter: Payer: Self-pay | Admitting: Emergency Medicine

## 2016-01-22 ENCOUNTER — Emergency Department
Admission: EM | Admit: 2016-01-22 | Discharge: 2016-01-22 | Disposition: A | Discharge status: Home / Self Care | Payer: BLUE CROSS/BLUE SHIELD | Attending: Emergency Medicine | Admitting: Emergency Medicine

## 2016-01-22 DIAGNOSIS — Y999 Unspecified external cause status: Secondary | ICD-10-CM | POA: Insufficient documentation

## 2016-01-22 DIAGNOSIS — S92912A Unspecified fracture of left toe(s), initial encounter for closed fracture: Secondary | ICD-10-CM

## 2016-01-22 DIAGNOSIS — Y9355 Activity, bike riding: Secondary | ICD-10-CM | POA: Insufficient documentation

## 2016-01-22 DIAGNOSIS — S93402A Sprain of unspecified ligament of left ankle, initial encounter: Secondary | ICD-10-CM

## 2016-01-22 DIAGNOSIS — S92512A Displaced fracture of proximal phalanx of left lesser toe(s), initial encounter for closed fracture: Secondary | ICD-10-CM | POA: Insufficient documentation

## 2016-01-22 DIAGNOSIS — F329 Major depressive disorder, single episode, unspecified: Secondary | ICD-10-CM | POA: Insufficient documentation

## 2016-01-22 DIAGNOSIS — Y92410 Unspecified street and highway as the place of occurrence of the external cause: Secondary | ICD-10-CM | POA: Insufficient documentation

## 2016-01-22 DIAGNOSIS — F17200 Nicotine dependence, unspecified, uncomplicated: Secondary | ICD-10-CM | POA: Insufficient documentation

## 2016-01-22 DIAGNOSIS — Z79899 Other long term (current) drug therapy: Secondary | ICD-10-CM | POA: Insufficient documentation

## 2016-01-22 MED ORDER — HYDROCODONE-ACETAMINOPHEN 5-325 MG PO TABS: 1.0000 | ORAL_TABLET | ORAL | Status: DC | PRN

## 2016-01-22 NOTE — ED Notes (Signed)
Discussed discharge instructions, prescriptions, and follow-up care with patient. No questions or concerns at this time. Pt stable at discharge.  

## 2016-01-22 NOTE — Discharge Instructions (Signed)
Ankle Sprain °An ankle sprain is an injury to the strong, fibrous tissues (ligaments) that hold the bones of your ankle joint together.  °CAUSES °An ankle sprain is usually caused by a fall or by twisting your ankle. Ankle sprains most commonly occur when you step on the outer edge of your foot, and your ankle turns inward. People who participate in sports are more prone to these types of injuries.  °SYMPTOMS  °· Pain in your ankle. The pain may be present at rest or only when you are trying to stand or walk. °· Swelling. °· Bruising. Bruising may develop immediately or within 1 to 2 days after your injury. °· Difficulty standing or walking, particularly when turning corners or changing directions. °DIAGNOSIS  °Your caregiver will ask you details about your injury and perform a physical exam of your ankle to determine if you have an ankle sprain. During the physical exam, your caregiver will press on and apply pressure to specific areas of your foot and ankle. Your caregiver will try to move your ankle in certain ways. An X-ray exam may be done to be sure a bone was not broken or a ligament did not separate from one of the bones in your ankle (avulsion fracture).  °TREATMENT  °Certain types of braces can help stabilize your ankle. Your caregiver can make a recommendation for this. Your caregiver may recommend the use of medicine for pain. If your sprain is severe, your caregiver may refer you to a surgeon who helps to restore function to parts of your skeletal system (orthopedist) or a physical therapist. °HOME CARE INSTRUCTIONS  °· Apply ice to your injury for 1-2 days or as directed by your caregiver. Applying ice helps to reduce inflammation and pain. °· Put ice in a plastic bag. °· Place a towel between your skin and the bag. °· Leave the ice on for 15-20 minutes at a time, every 2 hours while you are awake. °· Only take over-the-counter or prescription medicines for pain, discomfort, or fever as directed by  your caregiver. °· Elevate your injured ankle above the level of your heart as much as possible for 2-3 days. °· If your caregiver recommends crutches, use them as instructed. Gradually put weight on the affected ankle. Continue to use crutches or a cane until you can walk without feeling pain in your ankle. °· If you have a plaster splint, wear the splint as directed by your caregiver. Do not rest it on anything harder than a pillow for the first 24 hours. Do not put weight on it. Do not get it wet. You may take it off to take a shower or bath. °· You may have been given an elastic bandage to wear around your ankle to provide support. If the elastic bandage is too tight (you have numbness or tingling in your foot or your foot becomes cold and blue), adjust the bandage to make it comfortable. °· If you have an air splint, you may blow more air into it or let air out to make it more comfortable. You may take your splint off at night and before taking a shower or bath. Wiggle your toes in the splint several times per day to decrease swelling. °SEEK MEDICAL CARE IF:  °· You have rapidly increasing bruising or swelling. °· Your toes feel extremely cold or you lose feeling in your foot. °· Your pain is not relieved with medicine. °SEEK IMMEDIATE MEDICAL CARE IF: °· Your toes are numb or blue. °·   You have severe pain that is increasing. MAKE SURE YOU:   Understand these instructions.  Will watch your condition.  Will get help right away if you are not doing well or get worse.   This information is not intended to replace advice given to you by your health care provider. Make sure you discuss any questions you have with your health care provider.   Document Released: 09/01/2005 Document Revised: 09/22/2014 Document Reviewed: 09/13/2011 Elsevier Interactive Patient Education 2016 Elsevier Inc.  Cryotherapy Cryotherapy is when you put ice on your injury. Ice helps lessen pain and puffiness (swelling) after an  injury. Ice works the best when you start using it in the first 24 to 48 hours after an injury. HOME CARE  Put a dry or damp towel between the ice pack and your skin.  You may press gently on the ice pack.  Leave the ice on for no more than 10 to 20 minutes at a time.  Check your skin after 5 minutes to make sure your skin is okay.  Rest at least 20 minutes between ice pack uses.  Stop using ice when your skin loses feeling (numbness).  Do not use ice on someone who cannot tell you when it hurts. This includes small children and people with memory problems (dementia). GET HELP RIGHT AWAY IF:  You have white spots on your skin.  Your skin turns blue or pale.  Your skin feels waxy or hard.  Your puffiness gets worse. MAKE SURE YOU:   Understand these instructions.  Will watch your condition.  Will get help right away if you are not doing well or get worse.   This information is not intended to replace advice given to you by your health care provider. Make sure you discuss any questions you have with your health care provider.   Document Released: 02/18/2008 Document Revised: 11/24/2011 Document Reviewed: 04/24/2011 Elsevier Interactive Patient Education 2016 Elsevier Inc.  Cast or Splint Care Casts and splints support injured limbs and keep bones from moving while they heal. It is important to care for your cast or splint at home.  HOME CARE INSTRUCTIONS  Keep the cast or splint uncovered during the drying period. It can take 24 to 48 hours to dry if it is made of plaster. A fiberglass cast will dry in less than 1 hour.  Do not rest the cast on anything harder than a pillow for the first 24 hours.  Do not put weight on your injured limb or apply pressure to the cast until your health care provider gives you permission.  Keep the cast or splint dry. Wet casts or splints can lose their shape and may not support the limb as well. A wet cast that has lost its shape can  also create harmful pressure on your skin when it dries. Also, wet skin can become infected.  Cover the cast or splint with a plastic bag when bathing or when out in the rain or snow. If the cast is on the trunk of the body, take sponge baths until the cast is removed.  If your cast does become wet, dry it with a towel or a blow dryer on the cool setting only.  Keep your cast or splint clean. Soiled casts may be wiped with a moistened cloth.  Do not place any hard or soft foreign objects under your cast or splint, such as cotton, toilet paper, lotion, or powder.  Do not try to scratch the skin under  the cast with any object. The object could get stuck inside the cast. Also, scratching could lead to an infection. If itching is a problem, use a blow dryer on a cool setting to relieve discomfort.  Do not trim or cut your cast or remove padding from inside of it.  Exercise all joints next to the injury that are not immobilized by the cast or splint. For example, if you have a long leg cast, exercise the hip joint and toes. If you have an arm cast or splint, exercise the shoulder, elbow, thumb, and fingers.  Elevate your injured arm or leg on 1 or 2 pillows for the first 1 to 3 days to decrease swelling and pain.It is best if you can comfortably elevate your cast so it is higher than your heart. SEEK MEDICAL CARE IF:   Your cast or splint cracks.  Your cast or splint is too tight or too loose.  You have unbearable itching inside the cast.  Your cast becomes wet or develops a soft spot or area.  You have a bad smell coming from inside your cast.  You get an object stuck under your cast.  Your skin around the cast becomes red or raw.  You have new pain or worsening pain after the cast has been applied. SEEK IMMEDIATE MEDICAL CARE IF:   You have fluid leaking through the cast.  You are unable to move your fingers or toes.  You have discolored (blue or white), cool, painful, or very  swollen fingers or toes beyond the cast.  You have tingling or numbness around the injured area.  You have severe pain or pressure under the cast.  You have any difficulty with your breathing or have shortness of breath.  You have chest pain.   This information is not intended to replace advice given to you by your health care provider. Make sure you discuss any questions you have with your health care provider.   Document Released: 08/29/2000 Document Revised: 06/22/2013 Document Reviewed: 03/10/2013 Elsevier Interactive Patient Education [employer]2016 Elsevier Inc.
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
## 2016-04-14 ENCOUNTER — Emergency Department: Payer: Self-pay

## 2016-04-14 ENCOUNTER — Emergency Department
Admission: EM | Admit: 2016-04-14 | Discharge: 2016-04-14 | Disposition: A | Discharge status: Home / Self Care | Payer: Self-pay | Attending: Emergency Medicine | Admitting: Emergency Medicine

## 2016-04-14 ENCOUNTER — Encounter: Payer: Self-pay | Admitting: Emergency Medicine

## 2016-04-14 DIAGNOSIS — Z79899 Other long term (current) drug therapy: Secondary | ICD-10-CM | POA: Insufficient documentation

## 2016-04-14 DIAGNOSIS — M5431 Sciatica, right side: Secondary | ICD-10-CM

## 2016-04-14 DIAGNOSIS — Y990 Civilian activity done for income or pay: Secondary | ICD-10-CM | POA: Insufficient documentation

## 2016-04-14 DIAGNOSIS — S39012A Strain of muscle, fascia and tendon of lower back, initial encounter: Secondary | ICD-10-CM | POA: Insufficient documentation

## 2016-04-14 DIAGNOSIS — F17200 Nicotine dependence, unspecified, uncomplicated: Secondary | ICD-10-CM | POA: Insufficient documentation

## 2016-04-14 DIAGNOSIS — X500XXA Overexertion from strenuous movement or load, initial encounter: Secondary | ICD-10-CM | POA: Insufficient documentation

## 2016-04-14 DIAGNOSIS — M5441 Lumbago with sciatica, right side: Secondary | ICD-10-CM | POA: Insufficient documentation

## 2016-04-14 DIAGNOSIS — Y9389 Activity, other specified: Secondary | ICD-10-CM | POA: Insufficient documentation

## 2016-04-14 DIAGNOSIS — Y929 Unspecified place or not applicable: Secondary | ICD-10-CM | POA: Insufficient documentation

## 2016-04-14 MED ORDER — OXYCODONE-ACETAMINOPHEN 5-325 MG PO TABS: 1.0000 | ORAL_TABLET | ORAL | 0 refills | Status: DC | PRN

## 2016-04-14 MED ORDER — OXYCODONE-ACETAMINOPHEN 5-325 MG PO TABS
1.0000 | ORAL_TABLET | Freq: Once | ORAL | Status: AC
  Administered 2016-04-14: 1 via ORAL
  Filled 2016-04-14: qty 1

## 2016-04-14 MED ORDER — KETOROLAC TROMETHAMINE 60 MG/2ML IM SOLN
60.0000 mg | Freq: Once | INTRAMUSCULAR | Status: AC
  Administered 2016-04-14: 60 mg via INTRAMUSCULAR
  Filled 2016-04-14: qty 2

## 2016-04-14 MED ORDER — MELOXICAM 15 MG PO TABS: 15.0000 mg | ORAL_TABLET | Freq: Every day | ORAL | 0 refills | Status: DC

## 2016-04-14 MED ORDER — METHOCARBAMOL 750 MG PO TABS: 750.0000 mg | ORAL_TABLET | Freq: Four times a day (QID) | ORAL | 0 refills | Status: DC

## 2016-04-14 NOTE — ED Triage Notes (Signed)
Lower back pain x 3 weeks. 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

## 2016-04-14 NOTE — ED Provider Notes (Signed)
[facility] Emergency Department Provider Note  ____________________________________________  Time seen: Approximately 1:23 PM  I have reviewed the triage vital signs and the nursing notes.   HISTORY  Chief Complaint Back Pain    HPI Jose Zimmerman is a 30 y.o. male resents for evaluation of low back pain 3 weeks. 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 In addition he states he's been working on his back on some gravel and concrete working underneath a tractor. Denies any direct trauma to his spine.   Past Medical History:  Diagnosis Date  . Anxiety   . Depression   . GERD (gastroesophageal reflux disease)   . Vertigo     Patient Active Problem List   Diagnosis Date Noted  . Enteritis 09/15/2015  . Depression with anxiety 03/15/2015  . Chronic pain syndrome 03/15/2015  . GERD (gastroesophageal reflux disease) 03/15/2015  . Restless legs syndrome 03/15/2015    Past Surgical History:  Procedure Laterality Date  . ULNAR NERVE TRANSPOSITION Left 2009    Prior to Admission medications   Medication Sig Start Date End Date Taking? Authorizing Provider  fluticasone (FLONASE) 50 MCG/ACT nasal spray Place 1 spray into both nostrils 2 (two) times daily. 08/10/15   Barbaraann Barthel, NP  meloxicam (MOBIC) 15 MG tablet Take 1 tablet (15 mg total) by mouth daily. 04/14/16   Charmayne Sheer Beers, PA-C  methocarbamol (ROBAXIN) 750 MG tablet Take 1 tablet (750 mg total) by mouth 4 (four) times daily. 04/14/16   Charmayne Sheer Beers, PA-C  nicotine (NICODERM CQ - DOSED IN MG/24 HOURS) 14 mg/24hr patch Place 1 patch (14 mg total) onto the skin daily. 09/16/15   Altamese Dilling, MD  oxyCODONE-acetaminophen (ROXICET) 5-325 MG tablet Take 1-2 tablets by mouth every 4 (four) hours as needed for severe pain. 04/14/16   Charmayne Sheer Beers, PA-C  pantoprazole (PROTONIX) 40 MG tablet Take 1 tablet (40 mg total) by mouth 2 (two) times daily. 09/16/15    Altamese Dilling, MD    Allergies Gabapentin and Keppra [levetiracetam]  Family History  Problem Relation Age of Onset  . Osteoporosis Mother   . Diabetes Father   . Hypertension Father   . Hyperlipidemia Father   . Crohn's disease Other     Social History Social History  Substance Use Topics  . Smoking status: Current Every Day Smoker    Packs/day: 1.50    Years: 10.00  . Smokeless tobacco: Current User  . Alcohol use Yes     Comment: socially    Review of Systems Constitutional: No fever/chills Cardiovascular: Denies chest pain. Respiratory: Denies shortness of breath. Musculoskeletal:Positive for low back pain. Skin: Negative for rash. Neurological: Negative for headaches, focal weakness or numbness.  10-point ROS otherwise negative.  ____________________________________________   PHYSICAL EXAM:  VITAL SIGNS: ED Triage Vitals  Enc Vitals Group     BP 04/14/16 1242 135/84     Pulse Rate 04/14/16 1242 86     Resp 04/14/16 1242 20     Temp 04/14/16 1242 98.2 F (36.8 C)     Temp Source 04/14/16 1242 Oral     SpO2 04/14/16 1242 99 %     Weight 04/14/16 1242 150 lb (68 kg)     Height 04/14/16 1242  (1.803 m)     Head Circumference --      Peak Flow --      Pain Score 04/14/16 1243 7     Pain Loc --  Pain Edu? --      Excl. in GC? --     Constitutional: Alert and oriented. Well appearing and in no acute distress. Neck: No stridor.  Supple, full range of motion nontender. Cardiovascular: Normal rate, regular rhythm. Grossly normal heart sounds.  Good peripheral circulation. Respiratory: Normal respiratory effort.  No retractions. Lungs CTAB. Musculoskeletal: No lower extremity tenderness nor edema.  No joint effusions. Point tenderness noted to lumbar spine. Straight leg raise negative. Distally neurovascularly intact. Neurologic:  Normal speech and language. No gross focal neurologic deficits are appreciated. No gait instability. Skin:   Skin is warm, dry and intact. No rash noted. Psychiatric: Mood and affect are normal. Speech and behavior are normal.  ____________________________________________   LABS (all labs ordered are listed, but only abnormal results are displayed)  Labs Reviewed - No data to display ____________________________________________  EKG   ____________________________________________  RADIOLOGY  osseous findings. ____________________________________________   PROCEDURES  Procedure(s) performed: None  Critical Care performed: No  ____________________________________________   INITIAL IMPRESSION / ASSESSMENT AND PLAN / ED COURSE  Pertinent labs & imaging results that were available during my care of the patient were reviewed by me and considered in my medical decision making (see chart for details).  Acute lumbar strain. Rx given for meloxicam, Robaxin, Percocet. Patient to follow up PCP or return to ER with any worsening symptomology.  Clinical Course    ____________________________________________   FINAL CLINICAL IMPRESSION(S) / ED DIAGNOSES  Final diagnoses:  Sciatica of right side  Lumbar strain, initial encounter     This chart was dictated using voice recognition software/Dragon. Despite best efforts to proofread, errors can occur which can change the meaning. Any change was purely unintentional.    Evangeline Dakin, PA-C 04/14/16 1558    Sharyn Creamer, MD 04/14/16 [id number]

## 2016-06-15 ENCOUNTER — Encounter: Payer: Self-pay | Admitting: Emergency Medicine

## 2016-06-15 ENCOUNTER — Emergency Department: Payer: BLUE CROSS/BLUE SHIELD

## 2016-06-15 ENCOUNTER — Emergency Department
Admission: EM | Admit: 2016-06-15 | Discharge: 2016-06-15 | Disposition: A | Discharge status: Home / Self Care | Payer: BLUE CROSS/BLUE SHIELD | Attending: Emergency Medicine | Admitting: Emergency Medicine

## 2016-06-15 DIAGNOSIS — R0789 Other chest pain: Secondary | ICD-10-CM | POA: Insufficient documentation

## 2016-06-15 DIAGNOSIS — F17200 Nicotine dependence, unspecified, uncomplicated: Secondary | ICD-10-CM | POA: Insufficient documentation

## 2016-06-15 MED ORDER — HYDROCODONE-ACETAMINOPHEN 5-325 MG PO TABS
ORAL_TABLET | ORAL | Status: AC
  Filled 2016-06-15: qty 1

## 2016-06-15 MED ORDER — IBUPROFEN 800 MG PO TABS
ORAL_TABLET | ORAL | Status: AC
  Filled 2016-06-15: qty 1

## 2016-06-15 MED ORDER — IBUPROFEN 800 MG PO TABS
800.0000 mg | ORAL_TABLET | Freq: Once | ORAL | Status: AC
  Administered 2016-06-15: 800 mg via ORAL

## 2016-06-15 MED ORDER — HYDROCODONE-ACETAMINOPHEN 5-325 MG PO TABS
1.0000 | ORAL_TABLET | Freq: Once | ORAL | Status: AC
  Administered 2016-06-15: 1 via ORAL

## 2016-06-15 MED ORDER — HYDROCODONE-ACETAMINOPHEN 5-325 MG PO TABS: 1.0000 | ORAL_TABLET | ORAL | 0 refills | Status: DC | PRN

## 2016-06-15 NOTE — ED Triage Notes (Signed)
Pt says he was at work last weekend, lifted something heavy and felt a popping sensation in his left chest; pain has progressively worsened over the last week; feeling short of breath; productive cough; denies fever; pt says he was at work but does not wish to file worker's comp

## 2016-06-15 NOTE — ED Provider Notes (Signed)
[facility] Emergency Department Provider Note   ____________________________________________    I have reviewed the triage vital signs and the nursing notes.   HISTORY  Chief Complaint Chest Pain     HPI Jose Zimmerman is a 30 y.o. male who presents with complaints of one week of left lateral chest wall pain. Patient reports he was lifting a heavy object one week ago and felt a pop in the area. He has had pain there since. He has tried icing it and taken Motrin with little improvement. He denies shortness of breath. No other injuries reported. No back pain. He reports the pain is moderate and aching in nature   Past Medical History:  Diagnosis Date  . Anxiety   . Depression   . GERD (gastroesophageal reflux disease)   . Vertigo     Patient Active Problem List   Diagnosis Date Noted  . Enteritis 09/15/2015  . Depression with anxiety 03/15/2015  . Chronic pain syndrome 03/15/2015  . GERD (gastroesophageal reflux disease) 03/15/2015  . Restless legs syndrome 03/15/2015    Past Surgical History:  Procedure Laterality Date  . ULNAR NERVE TRANSPOSITION Left 2009    Prior to Admission medications   Medication Sig Start Date End Date Taking? Authorizing Provider  omeprazole (PRILOSEC) 20 MG capsule Take 20 mg by mouth daily.   Yes Historical Provider, MD  fluticasone (FLONASE) 50 MCG/ACT nasal spray Place 1 spray into both nostrils 2 (two) times daily. 08/10/15   Barbaraann Barthelina A Betancourt, NP  HYDROcodone-acetaminophen (NORCO/VICODIN) 5-325 MG tablet Take 1 tablet by mouth every 4 (four) hours as needed for moderate pain. 06/15/16   Jene Everyobert Theodore Virgin, MD  meloxicam (MOBIC) 15 MG tablet Take 1 tablet (15 mg total) by mouth daily. 04/14/16   Charmayne Sheerharles M Beers, PA-C  methocarbamol (ROBAXIN) 750 MG tablet Take 1 tablet (750 mg total) by mouth 4 (four) times daily. 04/14/16   Charmayne Sheerharles M Beers, PA-C  nicotine (NICODERM CQ - DOSED IN MG/24 HOURS) 14 mg/24hr patch  Place 1 patch (14 mg total) onto the skin daily. 09/16/15   Altamese DillingVaibhavkumar Vachhani, MD  oxyCODONE-acetaminophen (ROXICET) 5-325 MG tablet Take 1-2 tablets by mouth every 4 (four) hours as needed for severe pain. 04/14/16   Charmayne Sheerharles M Beers, PA-C  pantoprazole (PROTONIX) 40 MG tablet Take 1 tablet (40 mg total) by mouth 2 (two) times daily. 09/16/15   Altamese DillingVaibhavkumar Vachhani, MD     Allergies Gabapentin and Keppra [levetiracetam]  Family History  Problem Relation Age of Onset  . Osteoporosis Mother   . Diabetes Father   . Hypertension Father   . Hyperlipidemia Father   . Crohn's disease Other     Social History Social History  Substance Use Topics  . Smoking status: Current Every Day Smoker    Packs/day: 1.50    Years: 10.00  . Smokeless tobacco: Current User  . Alcohol use Yes     Comment: socially    Review of Systems  Constitutional: No fever/chills     Gastrointestinal: No abdominal pain.    Musculoskeletal: Negative for back pain. Skin: Negative for rash. Neurological: Negative for headaches     ____________________________________________   PHYSICAL EXAM:  VITAL SIGNS: ED Triage Vitals  Enc Vitals Group     BP 06/15/16 1940 (!) 134/93     Pulse Rate 06/15/16 1940 86     Resp 06/15/16 1940 18     Temp 06/15/16 1940 98.7 F (37.1 C)  Temp Source 06/15/16 1940 Oral     SpO2 06/15/16 1940 99 %     Weight 06/15/16 1941 150 lb (68 kg)     Height 06/15/16 1941 5\' 9"  (1.753 m)     Head Circumference --      Peak Flow --      Pain Score 06/15/16 1941 7     Pain Loc --      Pain Edu? --      Excl. in GC? --     Constitutional: Alert and oriented. No acute distress. Pleasant and interactive Eyes: Conjunctivae are normal.    Cardiovascular: Normal rate, regular rhythm. Point tenderness to palpation along the left inferior rib but no bony abnormalities or erythema Respiratory: Normal respiratory effort.  No retractions. Genitourinary:  deferred Musculoskeletal: No lower extremity tenderness nor edema.   Neurologic:  Normal speech and language. No gross focal neurologic deficits are appreciated.   Skin:  Skin is warm, dry and intact. No rash noted.   ____________________________________________   LABS (all labs ordered are listed, but only abnormal results are displayed)  Labs Reviewed - No data to display ____________________________________________  EKG   ____________________________________________  RADIOLOGY  Chest x-ray normal ____________________________________________   PROCEDURES  Procedure(s) performed: No    Critical Care performed: No ____________________________________________   INITIAL IMPRESSION / ASSESSMENT AND PLAN / ED COURSE  Pertinent labs & imaging results that were available during my care of the patient were reviewed by me and considered in my medical decision making (see chart for details).  Exam consistent with muscular skeletal chest pain. Recommend supportive care. Follow-up with PCP or return if worsening condition.   ____________________________________________   FINAL CLINICAL IMPRESSION(S) / ED DIAGNOSES  Final diagnoses:  Chest wall pain      NEW MEDICATIONS STARTED DURING THIS VISIT:  Discharge Medication List as of 06/15/2016  8:45 PM    START taking these medications   Details  HYDROcodone-acetaminophen (NORCO/VICODIN) 5-325 MG tablet Take 1 tablet by mouth every 4 (four) hours as needed for moderate pain., Starting Sun 06/15/2016, Print         Note:  This document was prepared using Dragon voice recognition software and may include unintentional dictation errors.    Jene Every, MD 06/15/16 2105

## 2016-09-22 ENCOUNTER — Encounter: Payer: Self-pay | Admitting: Emergency Medicine

## 2016-09-22 ENCOUNTER — Emergency Department: Payer: Self-pay

## 2016-09-22 ENCOUNTER — Emergency Department
Admission: EM | Admit: 2016-09-22 | Discharge: 2016-09-22 | Disposition: A | Discharge status: Home / Self Care | Payer: Self-pay | Attending: Emergency Medicine | Admitting: Emergency Medicine

## 2016-09-22 DIAGNOSIS — Z79899 Other long term (current) drug therapy: Secondary | ICD-10-CM | POA: Insufficient documentation

## 2016-09-22 DIAGNOSIS — R109 Unspecified abdominal pain: Secondary | ICD-10-CM

## 2016-09-22 DIAGNOSIS — F17200 Nicotine dependence, unspecified, uncomplicated: Secondary | ICD-10-CM | POA: Insufficient documentation

## 2016-09-22 DIAGNOSIS — I880 Nonspecific mesenteric lymphadenitis: Secondary | ICD-10-CM | POA: Insufficient documentation

## 2016-09-22 LAB — CBC
HCT: 48 % (ref 40.0–52.0)
Hemoglobin: 16.5 g/dL (ref 13.0–18.0)
MCH: 30.9 pg (ref 26.0–34.0)
MCHC: 34.3 g/dL (ref 32.0–36.0)
MCV: 90.1 fL (ref 80.0–100.0)
PLATELETS: 175 10*3/uL (ref 150–440)
RBC: 5.33 MIL/uL (ref 4.40–5.90)
RDW: 13.7 % (ref 11.5–14.5)
WBC: 7.1 10*3/uL (ref 3.8–10.6)

## 2016-09-22 LAB — COMPREHENSIVE METABOLIC PANEL
ALT: 13 U/L — AB (ref 17–63)
ANION GAP: 8 (ref 5–15)
AST: 19 U/L (ref 15–41)
Albumin: 4.3 g/dL (ref 3.5–5.0)
Alkaline Phosphatase: 56 U/L (ref 38–126)
BUN: 13 mg/dL (ref 6–20)
CHLORIDE: 100 mmol/L — AB (ref 101–111)
CO2: 29 mmol/L (ref 22–32)
Calcium: 9.3 mg/dL (ref 8.9–10.3)
Creatinine, Ser: 0.71 mg/dL (ref 0.61–1.24)
GFR calc non Af Amer: >60 mL/min (ref >60)
Glucose, Bld: 121 mg/dL — ABNORMAL HIGH (ref 65–99)
POTASSIUM: 3.6 mmol/L (ref 3.5–5.1)
SODIUM: 137 mmol/L (ref 135–145)
Total Bilirubin: 0.3 mg/dL (ref 0.3–1.2)
Total Protein: 7.5 g/dL (ref 6.5–8.1)

## 2016-09-22 LAB — URINALYSIS, COMPLETE (UACMP) WITH MICROSCOPIC
BILIRUBIN URINE: NEGATIVE
Bacteria, UA: NONE SEEN
Glucose, UA: NEGATIVE mg/dL
Hgb urine dipstick: NEGATIVE
Ketones, ur: 5 mg/dL — AB
Leukocytes, UA: NEGATIVE
Nitrite: NEGATIVE
PH: 5 (ref 5.0–8.0)
Protein, ur: 100 mg/dL — AB
SQUAMOUS EPITHELIAL / LPF: NONE SEEN
Specific Gravity, Urine: 1.033 — ABNORMAL HIGH (ref 1.005–1.030)

## 2016-09-22 LAB — CK: Total CK: 55 U/L (ref 49–397)

## 2016-09-22 MED ORDER — HYDROCODONE-ACETAMINOPHEN 5-325 MG PO TABS: 1.0000 | ORAL_TABLET | ORAL | 0 refills | Status: DC | PRN

## 2016-09-22 MED ORDER — ONDANSETRON 4 MG PO TBDP: 4.0000 mg | ORAL_TABLET | Freq: Three times a day (TID) | ORAL | 0 refills | Status: DC | PRN

## 2016-09-22 MED ORDER — OXYCODONE-ACETAMINOPHEN 5-325 MG PO TABS
2.0000 | ORAL_TABLET | Freq: Once | ORAL | Status: AC
  Administered 2016-09-22: 2 via ORAL
  Filled 2016-09-22: qty 2

## 2016-09-22 NOTE — ED Notes (Signed)
Pt to CT

## 2016-09-22 NOTE — Discharge Instructions (Signed)
You have been seen in the emergency department for back pain. Your CT scan shows possible mesenteric adenitis, which is inflammation of lymph nodes within your abdomen. This is often due to a viral illness/gastroenteritis. Please take your nausea medication as needed, pain medication as needed, as prescribed. Please drink plenty of fluids, obtain plenty of rest. Return to the emergency department for any increased pain, fever, or any other symptom personally concerning to yourself.

## 2016-09-22 NOTE — ED Provider Notes (Signed)
[facility] Emergency Department Provider Note  Time seen: 3:37 PM  I have reviewed the triage vital signs and the nursing notes.   HISTORY  Chief Complaint Flank Pain and Hematuria    HPI Jose Zimmerman is a 31 y.o. male with a past medical history of gastric reflux, depression, presents the emergency department for bilateral back pain/flank pain along with intermittent hematuria since yesterday. According to the patient he has had very dark/orange-appearing urine since yesterday. States bilateral lower back pain worse with movement. Denies any history of kidney stones. Denies any history of hematuria. Denies any pain when he urinates. Denies any fever or vomiting.  Past Medical History:  Diagnosis Date  . Anxiety   . Depression   . GERD (gastroesophageal reflux disease)   . Vertigo     Patient Active Problem List   Diagnosis Date Noted  . Enteritis 09/15/2015  . Depression with anxiety 03/15/2015  . Chronic pain syndrome 03/15/2015  . GERD (gastroesophageal reflux disease) 03/15/2015  . Restless legs syndrome 03/15/2015    Past Surgical History:  Procedure Laterality Date  . ULNAR NERVE TRANSPOSITION Left 2009    Prior to Admission medications   Medication Sig Start Date End Date Taking? Authorizing Provider  fluticasone (FLONASE) 50 MCG/ACT nasal spray Place 1 spray into both nostrils 2 (two) times daily. 08/10/15   Barbaraann Barthelina A Betancourt, NP  HYDROcodone-acetaminophen (NORCO/VICODIN) 5-325 MG tablet Take 1 tablet by mouth every 4 (four) hours as needed for moderate pain. 06/15/16   Jene Everyobert Kinner, MD  meloxicam (MOBIC) 15 MG tablet Take 1 tablet (15 mg total) by mouth daily. 04/14/16   Charmayne Sheerharles M Beers, PA-C  methocarbamol (ROBAXIN) 750 MG tablet Take 1 tablet (750 mg total) by mouth 4 (four) times daily. 04/14/16   Charmayne Sheerharles M Beers, PA-C  nicotine (NICODERM CQ - DOSED IN MG/24 HOURS) 14 mg/24hr patch Place 1 patch (14 mg total) onto the skin daily.  09/16/15   Altamese DillingVaibhavkumar Vachhani, MD  omeprazole (PRILOSEC) 20 MG capsule Take 20 mg by mouth daily.    Historical Provider, MD  oxyCODONE-acetaminophen (ROXICET) 5-325 MG tablet Take 1-2 tablets by mouth every 4 (four) hours as needed for severe pain. 04/14/16   Charmayne Sheerharles M Beers, PA-C  pantoprazole (PROTONIX) 40 MG tablet Take 1 tablet (40 mg total) by mouth 2 (two) times daily. 09/16/15   Altamese DillingVaibhavkumar Vachhani, MD    Allergies  Allergen Reactions  . Gabapentin Nausea And Vomiting    Drowsiness, confusion  . Keppra [Levetiracetam] Nausea And Vomiting    Drowsiness, confusion    Family History  Problem Relation Age of Onset  . Osteoporosis Mother   . Diabetes Father   . Hypertension Father   . Hyperlipidemia Father   . Crohn's disease Other     Social History Social History  Substance Use Topics  . Smoking status: Current Every Day Smoker    Packs/day: 1.50    Years: 10.00  . Smokeless tobacco: Current User  . Alcohol use Yes     Comment: socially    Review of Systems Constitutional: Negative for fever. Cardiovascular: Negative for chest pain. Respiratory: Negative for shortness of breath. Gastrointestinal:Positive for bilateral flank pain and back pain. Denies diarrhea but states intermittent nausea, one episode of vomiting today. Genitourinary: Negative for dysuria. Positive for dark orange/red urine Musculoskeletal: Positive for bilateral lower back pain. Neurological: Negative for headache 10-point ROS otherwise negative.  ____________________________________________   PHYSICAL EXAM:  VITAL SIGNS: ED Triage Vitals  Enc Vitals Group     BP 09/22/16 1212 131/85     Pulse Rate 09/22/16 1212 (!) 120     Resp 09/22/16 1212 18     Temp 09/22/16 1212 98.9 F (37.2 C)     Temp Source 09/22/16 1212 Oral     SpO2 09/22/16 1212 100 %     Weight 09/22/16 1212 130 lb (59 kg)     Height 09/22/16 1212 5\' 9"  (1.753 m)     Head Circumference --      Peak Flow --      Pain  Score 09/22/16 1213 8     Pain Loc --      Pain Edu? --      Excl. in GC? --     Constitutional: Alert and oriented. Well appearing and in no distress. Eyes: Normal exam ENT   Head: Normocephalic and atraumatic.   Mouth/Throat: Mucous membranes are moist. Cardiovascular: Normal rate, regular rhythm. No murmur Respiratory: Normal respiratory effort without tachypnea nor retractions. Breath sounds are clear  Gastrointestinal: Soft and nontender. No distention.  Moderate bilateral CVA tenderness to palpation. Musculoskeletal: Nontender with normal range of motion in all extremities. Mild lower back tenderness. Neurologic:  Normal speech and language. No gross focal neurologic deficits Skin:  Skin is warm, dry and intact.  Psychiatric: Mood and affect are normal.   ____________________________________________   INITIAL IMPRESSION / ASSESSMENT AND PLAN / ED COURSE  Pertinent labs & imaging results that were available during my care of the patient were reviewed by me and considered in my medical decision making (see chart for details).  The patient presents to the emergency department with bilateral lower back pain/flank pain. Nontender abdominal exam. Moderate CVA tenderness bilaterally.Marland Kitchen His urinalysis does not appear to show any blood but it is amber in color. We will check labs, treat the patient's discomfort and obtain a CT renal scan to further evaluate.  Labs are largely within normal limits. CT is negative for ureteral lithiasis but positive for possible mesenteric adenitis. Patient does state nausea with occasional vomiting. We will discharge with pain medication and nausea medication. Overall the patient appears well, no acute distress.  ____________________________________________   FINAL CLINICAL IMPRESSION(S) / ED DIAGNOSES  Flank pain, bilateral Mesenteric adenitis   Minna Antis, MD 09/22/16 1726

## 2016-09-22 NOTE — ED Triage Notes (Signed)
Pt reports bilateral flank pain for three days. Pt reports seeing blood in his urine last night and this morning. Pt reports nausea and vomiting. Pt denies pain upon urination.

## 2016-10-08 ENCOUNTER — Encounter: Payer: Self-pay | Admitting: *Deleted

## 2016-10-08 ENCOUNTER — Emergency Department
Admission: EM | Admit: 2016-10-08 | Discharge: 2016-10-08 | Disposition: A | Discharge status: Home / Self Care | Payer: BLUE CROSS/BLUE SHIELD | Attending: Emergency Medicine | Admitting: Emergency Medicine

## 2016-10-08 DIAGNOSIS — K0381 Cracked tooth: Secondary | ICD-10-CM | POA: Insufficient documentation

## 2016-10-08 DIAGNOSIS — K029 Dental caries, unspecified: Secondary | ICD-10-CM | POA: Insufficient documentation

## 2016-10-08 DIAGNOSIS — F17200 Nicotine dependence, unspecified, uncomplicated: Secondary | ICD-10-CM | POA: Insufficient documentation

## 2016-10-08 DIAGNOSIS — K0889 Other specified disorders of teeth and supporting structures: Secondary | ICD-10-CM

## 2016-10-08 MED ORDER — AMOXICILLIN 500 MG PO TABS: 500.0000 mg | ORAL_TABLET | Freq: Three times a day (TID) | ORAL | 0 refills | Status: AC

## 2016-10-08 MED ORDER — KETOROLAC TROMETHAMINE 60 MG/2ML IM SOLN
60.0000 mg | Freq: Once | INTRAMUSCULAR | Status: AC
  Administered 2016-10-08: 60 mg via INTRAMUSCULAR
  Filled 2016-10-08: qty 2

## 2016-10-08 MED ORDER — LIDOCAINE VISCOUS 2 % MT SOLN
15.0000 mL | Freq: Once | OROMUCOSAL | Status: AC
  Administered 2016-10-08: 15 mL via OROMUCOSAL
  Filled 2016-10-08: qty 15

## 2016-10-08 MED ORDER — NAPROXEN 500 MG PO TBEC: 500.0000 mg | DELAYED_RELEASE_TABLET | Freq: Two times a day (BID) | ORAL | 1 refills | Status: AC

## 2016-10-08 NOTE — ED Provider Notes (Signed)
[facility] Emergency Department Provider Note  ____________________________________________  Time seen: Approximately 8:07 PM  I have reviewed the triage vital signs and the nursing notes.   HISTORY  Chief Complaint Dental Pain    HPI Jose Zimmerman is a 31 y.o. male presenting to the emergency department with inferior 19 and 31 pain. Patient has noticed left jaw edema but no surrounding erythema. Patient states that he just recently obtained insurance but it "hasn't gone through yet". Patient states that he is going to make an appointment with a dentist as soon as possible. He rates dental pain at 10 out of 10 in intensity and describes it as throbbing. He has not evaluated his temperature, but has "felt hot". Patient denies history of dental abscesses.   Past Medical History:  Diagnosis Date  . Anxiety   . Depression   . GERD (gastroesophageal reflux disease)   . Vertigo     Patient Active Problem List   Diagnosis Date Noted  . Enteritis 09/15/2015  . Depression with anxiety 03/15/2015  . Chronic pain syndrome 03/15/2015  . GERD (gastroesophageal reflux disease) 03/15/2015  . Restless legs syndrome 03/15/2015    Past Surgical History:  Procedure Laterality Date  . ULNAR NERVE TRANSPOSITION Left 2009    Prior to Admission medications   Medication Sig Start Date End Date Taking? Authorizing Provider  amoxicillin (AMOXIL) 500 MG tablet Take 1 tablet (500 mg total) by mouth 3 (three) times daily. 10/08/16 10/18/16  Orvil Feil, PA-C  fluticasone (FLONASE) 50 MCG/ACT nasal spray Place 1 spray into both nostrils 2 (two) times daily. 08/10/15   Barbaraann Barthel, NP  HYDROcodone-acetaminophen (NORCO/VICODIN) 5-325 MG tablet Take 1 tablet by mouth every 4 (four) hours as needed. 09/22/16   Minna Antis, MD  meloxicam (MOBIC) 15 MG tablet Take 1 tablet (15 mg total) by mouth daily. 04/14/16   Charmayne Sheer Beers, PA-C  methocarbamol (ROBAXIN) 750 MG  tablet Take 1 tablet (750 mg total) by mouth 4 (four) times daily. 04/14/16   Evangeline Dakin, PA-C  naproxen (EC NAPROSYN) 500 MG EC tablet Take 1 tablet (500 mg total) by mouth 2 (two) times daily with a meal. 10/08/16 10/18/16  Orvil Feil, PA-C  nicotine (NICODERM CQ - DOSED IN MG/24 HOURS) 14 mg/24hr patch Place 1 patch (14 mg total) onto the skin daily. 09/16/15   Altamese Dilling, MD  omeprazole (PRILOSEC) 20 MG capsule Take 20 mg by mouth daily.    Historical Provider, MD  ondansetron (ZOFRAN ODT) 4 MG disintegrating tablet Take 1 tablet (4 mg total) by mouth every 8 (eight) hours as needed for nausea or vomiting. 09/22/16   Minna Antis, MD  oxyCODONE-acetaminophen (ROXICET) 5-325 MG tablet Take 1-2 tablets by mouth every 4 (four) hours as needed for severe pain. 04/14/16   Charmayne Sheer Beers, PA-C  pantoprazole (PROTONIX) 40 MG tablet Take 1 tablet (40 mg total) by mouth 2 (two) times daily. 09/16/15   Altamese Dilling, MD    Allergies Gabapentin and Keppra [levetiracetam]  Family History  Problem Relation Age of Onset  . Osteoporosis Mother   . Diabetes Father   . Hypertension Father   . Hyperlipidemia Father   . Crohn's disease Other     Social History Social History  Substance Use Topics  . Smoking status: Current Every Day Smoker    Packs/day: 1.50    Years: 10.00  . Smokeless tobacco: Current User  . Alcohol use Yes  Comment: socially     Review of Systems  Constitutional: No fever/chills ENT: Patient has dental pain. Cardiovascular: no chest pain. Respiratory: no cough. No SOB. Gastrointestinal: No abdominal pain.  No nausea, no vomiting.  No diarrhea.  No constipation. Musculoskeletal: Patient has left jaw pain.  Skin: Patient haw jaw edema Neurological: Negative for headaches, focal weakness or numbness. ____________________________________________   PHYSICAL EXAM:  VITAL SIGNS: ED Triage Vitals  Enc Vitals Group     BP 10/08/16 1743 128/74      Pulse Rate 10/08/16 1743 97     Resp 10/08/16 1743 18     Temp 10/08/16 1743 98.7 F (37.1 C)     Temp Source 10/08/16 1743 Oral     SpO2 10/08/16 1743 100 %     Weight 10/08/16 1744 130 lb (59 kg)     Height 10/08/16 1744 5\' 9"  (1.753 m)     Head Circumference --      Peak Flow --      Pain Score 10/08/16 1744 10     Pain Loc --      Pain Edu? --      Excl. in GC? --      Constitutional: Alert and oriented. Well appearing and in no acute distress. Eyes: Conjunctivae are normal. PERRL. EOMI. Head: Atraumatic. ENT:         Mouth/Throat: Mucous membranes are moist. Patient has numerous dental caries. Patient has broken teeth at inferior 19 and 31. Gingival hypertrophy and erythema surrounding inferior 19 and 31 was also visualized. Neck: FROM. Patient has cervical lymphadenopathy. Cardiovascular: Normal rate, regular rhythm. Normal S1 and S2.  Good peripheral circulation. Respiratory: Normal respiratory effort without tachypnea or retractions. Lungs CTAB. Good air entry to the bases with no decreased or absent breath sounds. Musculoskeletal: Patient has left jaw pain. Neurologic:  Normal speech and language. No gross focal neurologic deficits are appreciated.  Skin:  Patient has a 2 cm x 2 cm region of focal edema of the skin overlying the left jaw. No erythema or streaking visualized.  Psychiatric: Mood and affect are normal. Speech and behavior are normal. Patient exhibits appropriate insight and judgement. ____________________________________________   LABS (all labs ordered are listed, but only abnormal results are displayed)  Labs Reviewed - No data to display ____________________________________________  EKG   ____________________________________________  RADIOLOGY   No results found.  ____________________________________________    PROCEDURES  Procedure(s) performed:    Procedures    Medications  ketorolac (TORADOL) injection 60 mg (60 mg  Intramuscular Given 10/08/16 1956)  lidocaine (XYLOCAINE) 2 % viscous mouth solution 15 mL (15 mLs Mouth/Throat Given 10/08/16 1956)     ____________________________________________   INITIAL IMPRESSION / ASSESSMENT AND PLAN / ED COURSE  Pertinent labs & imaging results that were available during my care of the patient were reviewed by me and considered in my medical decision making (see chart for details).  Review of the [place] CSRS was performed in accordance of the [facility] prior to dispensing any controlled drugs.   Assessment and Plan:  Dental Pain:  Patient  presents to the emergency department with dental caries and dental pain. He has a 2 cm x 2 cm region of focal edema overlying the left jaw. Patient was discharged with naproxen and amoxicillin. Vital signs are reassuring at this time.  Patient was advised to follow-up with dentist as soon as possible. He does not currently have an appointment with a dentist. All patient questions were answered. ____________________________________________  FINAL CLINICAL IMPRESSION(S) / ED DIAGNOSES  Final diagnoses:  Pain, dental      NEW MEDICATIONS STARTED DURING THIS VISIT:  New Prescriptions   AMOXICILLIN (AMOXIL) 500 MG TABLET    Take 1 tablet (500 mg total) by mouth 3 (three) times daily.   NAPROXEN (EC NAPROSYN) 500 MG EC TABLET    Take 1 tablet (500 mg total) by mouth 2 (two) times daily with a meal.        This chart was dictated using voice recognition software/Dragon. Despite best efforts to proofread, errors can occur which can change the meaning. Any change was purely unintentional.    Orvil FeilJaclyn M Woods, PA-C 10/08/16 2023    Rockne MenghiniAnne-Caroline Norman, MD 10/08/16 2354

## 2016-10-08 NOTE — ED Triage Notes (Signed)
States bilateral dental pain for several days

## 2017-03-04 ENCOUNTER — Ambulatory Visit
Admission: EM | Admit: 2017-03-04 | Discharge: 2017-03-04 | Disposition: A | Discharge status: Home / Self Care | Payer: Self-pay | Attending: Emergency Medicine | Admitting: Emergency Medicine

## 2017-03-04 ENCOUNTER — Encounter: Payer: Self-pay | Admitting: *Deleted

## 2017-03-04 ENCOUNTER — Ambulatory Visit (INDEPENDENT_AMBULATORY_CARE_PROVIDER_SITE_OTHER): Payer: Self-pay

## 2017-03-04 DIAGNOSIS — S2241XA Multiple fractures of ribs, right side, initial encounter for closed fracture: Secondary | ICD-10-CM

## 2017-03-04 MED ORDER — MELOXICAM 15 MG PO TABS: 15.0000 mg | ORAL_TABLET | Freq: Every day | ORAL | 0 refills | Status: DC | PRN

## 2017-03-04 MED ORDER — OXYCODONE-ACETAMINOPHEN 5-325 MG PO TABS: 1.0000 | ORAL_TABLET | Freq: Three times a day (TID) | ORAL | 0 refills | Status: DC | PRN
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
## 2017-03-04 NOTE — Discharge Instructions (Signed)
Take medication as prescribed. Rest. Drink plenty of fluids. Apply ice. Avoid strenuous lifting or activity. Take deep breaths at least every hour, and multiple times per day as discussed.   Follow up with your primary care physician this week. Return to Urgent care for new or worsening concerns.
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
## 2017-05-05 IMAGING — CT CT RENAL STONE PROTOCOL
1 of 2 series · 14 of 32 positions shown, 18 images · non-contrast
Comparison: CT the abdomen and pelvis 08/02/2011.

CLINICAL DATA: 29-year-old male with history of vomiting today
since 9 a.m.. Low back pain.

EXAM:
CT ABDOMEN AND PELVIS WITHOUT CONTRAST
TECHNIQUE: Multidetector CT imaging of the abdomen and pelvis was performed
following the standard protocol without IV contrast.

[Series 2: stone standard full · axial · 0.68mm/px · z∈[-1122,-702]mm · 14 of 96 slices shown, 18 images]
[im 8/96  soft-tissue]
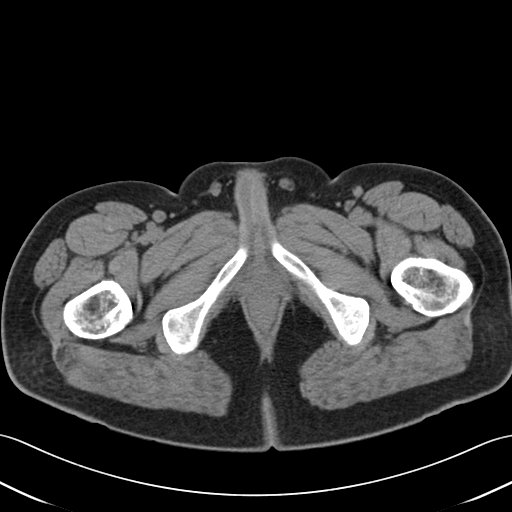
[im 8/96  bone]
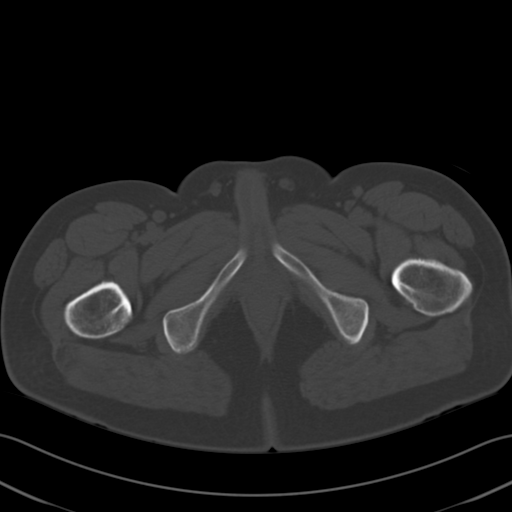
[im 16/96  soft-tissue]
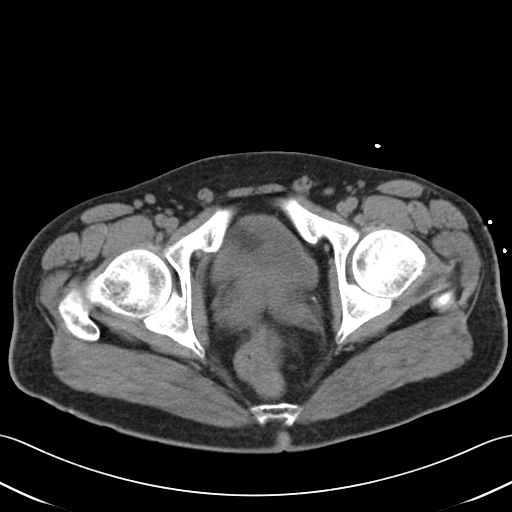
[im 23/96  soft-tissue]
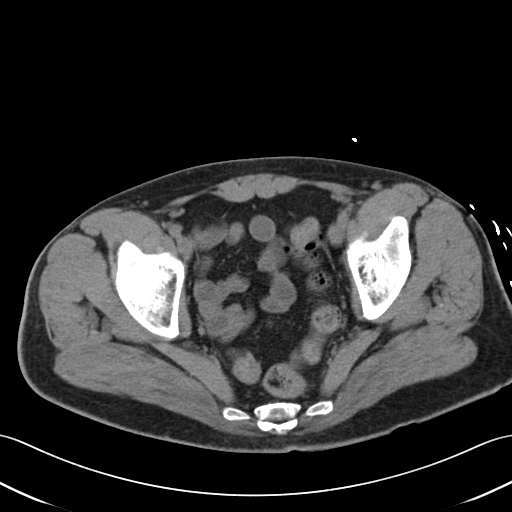
[im 31/96  soft-tissue]
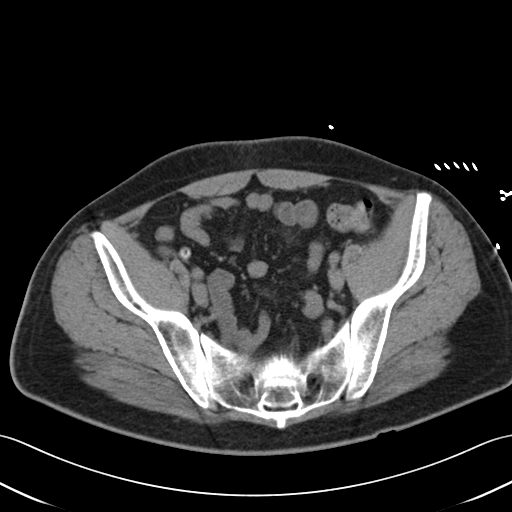
[im 39/96  soft-tissue]
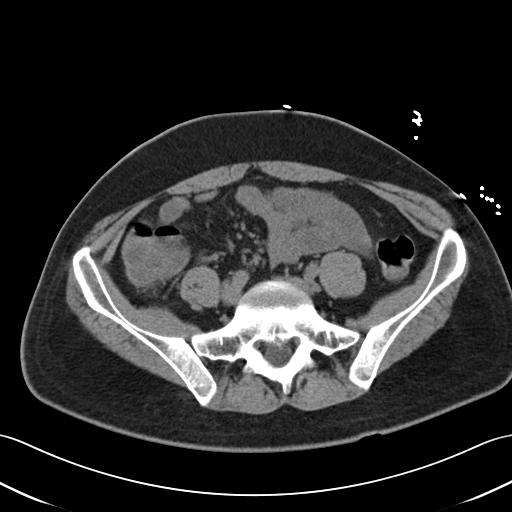
[im 46/96  soft-tissue]
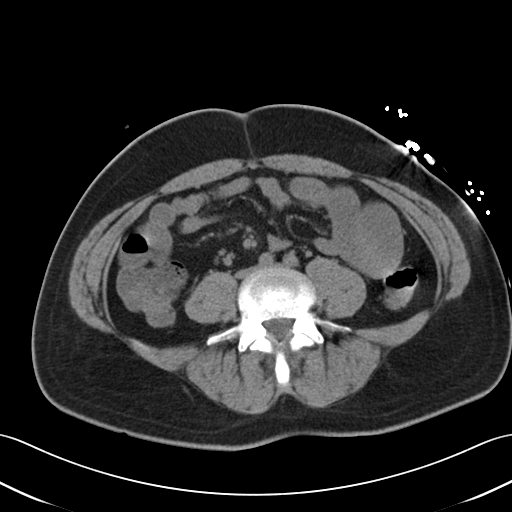
[im 54/96  soft-tissue]
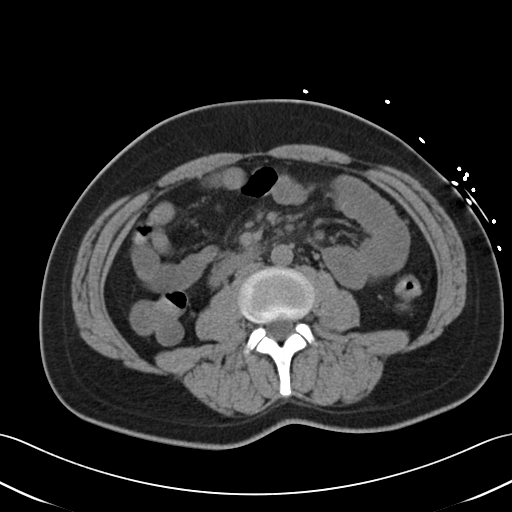
[im 61/96  soft-tissue]
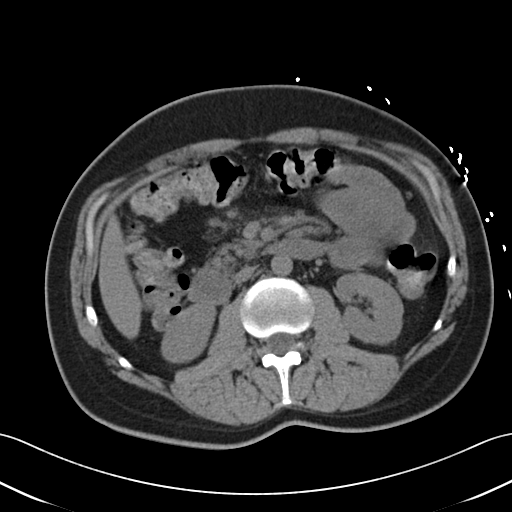
[im 69/96  soft-tissue]
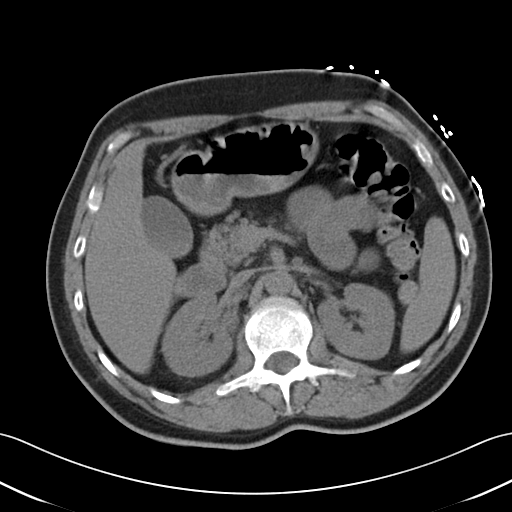
[im 69/96  bone]
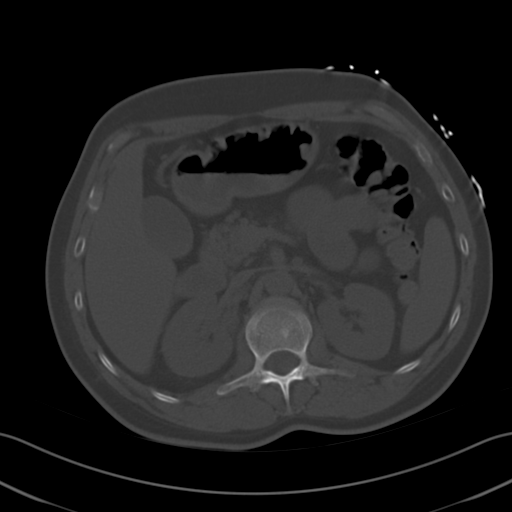
[im 77/96  soft-tissue]
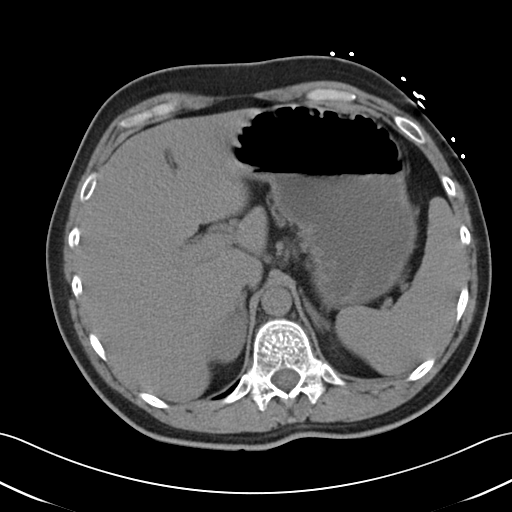
[im 80/96  lung]
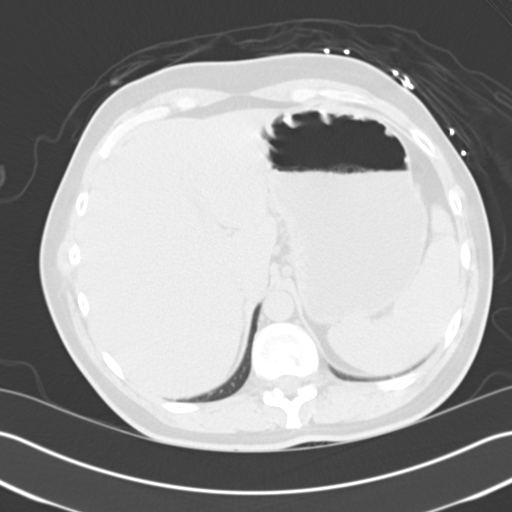
[im 84/96  soft-tissue]
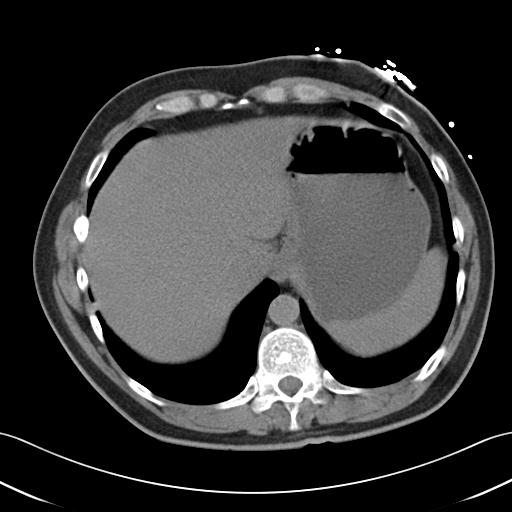
[im 84/96  lung]
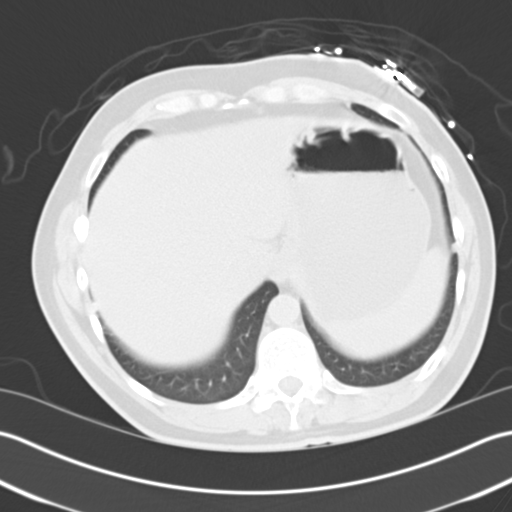
[im 88/96  lung]
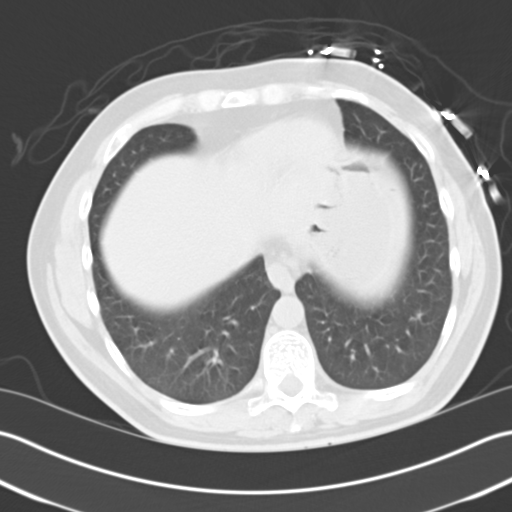
[im 92/96  soft-tissue]
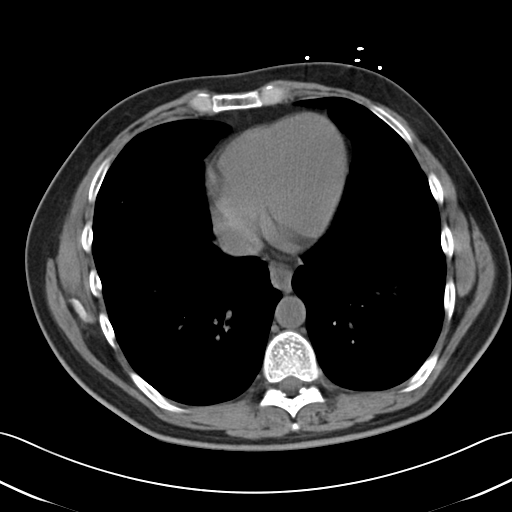
[im 92/96  lung]
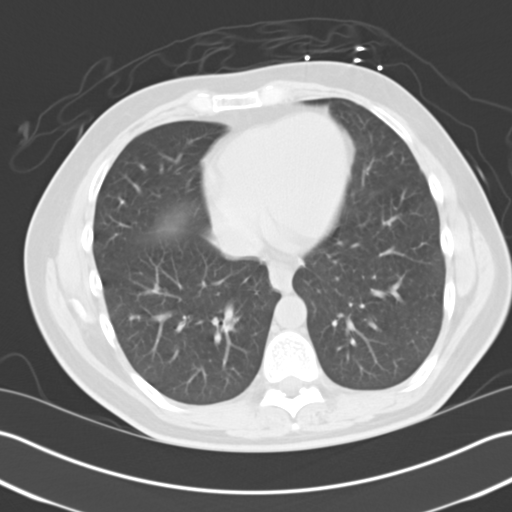

[14 of 32 positions shown; findings below may reference images not displayed]

FINDINGS: Lower chest: 4 mm subpleural nodule in the periphery of the right
lower lobe (image 12 of series 4), and a 5 mm subpleural nodule in
the periphery of the left lower lobe (image 13 of series 4) are both
unchanged compared to prior study 08/02/2011, considered benign,
likely subpleural lymph nodes.

Hepatobiliary: No definite cystic or solid hepatic lesions are
identified on today's noncontrast CT examination. Unenhanced
appearance of the gallbladder is normal.

Pancreas: No pancreatic mass or peripancreatic inflammatory changes
are identified on today's noncontrast CT examination.

Spleen: Unremarkable.

Adrenals/Urinary Tract: Unenhanced appearance of the kidneys and
bilateral adrenal glands is normal. No urinary tract calculi
identified. No hydroureteronephrosis. Urinary bladder is normal in
appearance.

Stomach/Bowel: Normal appearance of the stomach. No pathologic
dilatation of small bowel or colon. Normal appendix.

Vascular/Lymphatic: Mild atherosclerotic calcifications in the
abdominal and pelvic vasculature, without evidence of aneurysm.
There multiple prominent borderline enlarged mesenteric lymph nodes
throughout the root of the small bowel mesentery measuring up to 9
mm. No definite lymphadenopathy noted elsewhere in the abdomen or
pelvis on today's noncontrast CT examination.

Reproductive: Prostate gland and seminal vesicles are unremarkable
in appearance.

Other: No significant volume of ascites. No pneumoperitoneum.
Diffuse haziness in the root of the small bowel mesentery adjacent
to the multiple borderline enlarged lymph nodes.

Musculoskeletal: There are no aggressive appearing lytic or blastic
lesions noted in the visualized portions of the skeleton.
IMPRESSION: 1. Multiple borderline enlarged mesenteric lymph nodes and mild
haziness in the root of the small bowel mesentery. These findings
are nonspecific, but given the patient's symptoms, this could be
related to an enteritis. No obvious changes of the bowel are
identified on today's noncontrast CT examination.
2. Normal appendix.
3. Mild atherosclerosis.
4. Additional incidental findings, as above

## 2017-09-21 ENCOUNTER — Other Ambulatory Visit: Payer: Self-pay

## 2017-09-21 ENCOUNTER — Emergency Department
Admission: EM | Admit: 2017-09-21 | Discharge: 2017-09-21 | Disposition: A | Discharge status: Home / Self Care | Payer: BLUE CROSS/BLUE SHIELD | Attending: Emergency Medicine | Admitting: Emergency Medicine

## 2017-09-21 DIAGNOSIS — F17200 Nicotine dependence, unspecified, uncomplicated: Secondary | ICD-10-CM | POA: Diagnosis not present

## 2017-09-21 DIAGNOSIS — Y93H9 Activity, other involving exterior property and land maintenance, building and construction: Secondary | ICD-10-CM | POA: Insufficient documentation

## 2017-09-21 DIAGNOSIS — M7918 Myalgia, other site: Secondary | ICD-10-CM | POA: Insufficient documentation

## 2017-09-21 DIAGNOSIS — Z79899 Other long term (current) drug therapy: Secondary | ICD-10-CM | POA: Diagnosis not present

## 2017-09-21 DIAGNOSIS — M25522 Pain in left elbow: Secondary | ICD-10-CM | POA: Diagnosis present

## 2017-09-21 DIAGNOSIS — M25512 Pain in left shoulder: Secondary | ICD-10-CM | POA: Insufficient documentation

## 2017-09-21 DIAGNOSIS — M545 Low back pain: Secondary | ICD-10-CM | POA: Diagnosis not present

## 2017-09-21 MED ORDER — MELOXICAM 15 MG PO TABS: 15.0000 mg | ORAL_TABLET | Freq: Every day | ORAL | 2 refills | Status: DC

## 2017-09-21 MED ORDER — BACLOFEN 10 MG PO TABS: 10.0000 mg | ORAL_TABLET | Freq: Every day | ORAL | 1 refills | Status: DC

## 2017-09-21 NOTE — Discharge Instructions (Signed)
Take the medication as prescribed, use wet heat followed by ice to any areas that hurt, if you are not better in 5-7 days you should see your regular doctor, return to the emergency department if you are worsening

## 2017-09-21 NOTE — ED Triage Notes (Signed)
Pt c/o left shoulder and elbow pain that he has chronic issues with , states in the past couple of days the pain and swelling have flared up with no noted redness . Denies any resent injury

## 2017-09-21 NOTE — ED Notes (Signed)
See triage note  Presents with pain to left post shoulder,left elbow and left lower back  Unsure of injury but states he did a lot of lifting and moving debris this weekend  Ambulates well   States pian does radiate into left leg occasionally

## 2017-09-21 NOTE — ED Provider Notes (Signed)
[facility] Emergency Department Provider Note  ____________________________________________   First MD Initiated Contact with Patient 09/21/17 [phone]     (approximate)  I have reviewed the triage vital signs and the nursing notes.   HISTORY  Chief Complaint Arm Pain    HPI Jose Zimmerman is a 32 y.o. male complaining of left elbow pain low back pain and left shoulder pain states he cleaned out his shed and now he hurts all over, he does not remember a specific injury, he has been seen in the emergency department several times for the same complaints, he denies numbness or tingling   Past Medical History:  Diagnosis Date  . Anxiety   . Depression   . GERD (gastroesophageal reflux disease)   . Vertigo     Patient Active Problem List   Diagnosis Date Noted  . Enteritis 09/15/2015  . Depression with anxiety 03/15/2015  . Chronic pain syndrome 03/15/2015  . GERD (gastroesophageal reflux disease) 03/15/2015  . Restless legs syndrome 03/15/2015    Past Surgical History:  Procedure Laterality Date  . ULNAR NERVE TRANSPOSITION Left 2009    Prior to Admission medications   Medication Sig Start Date End Date Taking? Authorizing Provider  baclofen (LIORESAL) 10 MG tablet Take 1 tablet (10 mg total) by mouth daily. 09/21/17 09/21/18  Aviendha Azbell, Roselyn Bering, PA-C  meloxicam (MOBIC) 15 MG tablet Take 1 tablet (15 mg total) by mouth daily. 09/21/17 09/21/18  Nalee Lightle, Roselyn Bering, PA-C  Naproxen Sodium (ALEVE) 220 MG CAPS Take 440 mg by mouth daily.    [provider]  omeprazole (PRILOSEC) 20 MG capsule Take 20 mg by mouth daily.    [provider]    Allergies Gabapentin and Keppra [levetiracetam]  Family History  Problem Relation Age of Onset  . Osteoporosis Mother   . Diabetes Father   . Hypertension Father   . Hyperlipidemia Father   . Crohn's disease Other     Social History Social History   Tobacco Use  . Smoking status: Current  Every Day Smoker    Packs/day: 1.50    Years: 10.00    Pack years: 15.00  . Smokeless tobacco: Current User  Substance Use Topics  . Alcohol use: Yes    Comment: socially  . Drug use: No    Review of Systems  Constitutional: No fever/chills Eyes: No visual changes. ENT: No sore throat. Respiratory: Denies cough Genitourinary: Negative for dysuria. Musculoskeletal: Positive for back pain.  Positive left elbow pain, positive left shoulder pain Skin: Negative for rash.    ____________________________________________   PHYSICAL EXAM:  VITAL SIGNS: ED Triage Vitals  Enc Vitals Group     BP 09/21/17 0900 132/81     Pulse Rate 09/21/17 0900 92     Resp 09/21/17 0900 18     Temp 09/21/17 0900 98 F (36.7 C)     Temp Source 09/21/17 0900 Oral     SpO2 09/21/17 0900 100 %     Weight 09/21/17 0901 160 lb (72.6 kg)     Height 09/21/17 0901 5\' 9"  (1.753 m)     Head Circumference --      Peak Flow --      Pain Score 09/21/17 0905 8     Pain Loc --      Pain Edu? --      Excl. in GC? --     Constitutional: Alert and oriented. Well appearing and in no acute distress. Eyes: Conjunctivae are  normal.  Head: Atraumatic. Nose: No congestion/rhinnorhea. Mouth/Throat: Mucous membranes are moist.   Cardiovascular: Normal rate, regular rhythm.  Heart sounds are normal Respiratory: Normal respiratory effort.  No retractions, lungs are clear to auscultation GU: deferred Musculoskeletal: FROM all extremities, warm and well perfused, there is a small amount of difference from the left elbow to the right, minimal swelling is noted, left shoulder has spasms in the trapezius muscle, of the lumbar spine is not tender, patient is able to move all extremities without difficulty, neurovascular is intact Neurologic:  Normal speech and language.  Skin:  Skin is warm, dry and intact. No rash noted. Psychiatric: Mood and affect are normal. Speech and behavior are  normal.  ____________________________________________   LABS (all labs ordered are listed, but only abnormal results are displayed)  Labs Reviewed - No data to display ____________________________________________   ____________________________________________  RADIOLOGY    ____________________________________________   PROCEDURES  Procedure(s) performed: No      ____________________________________________   INITIAL IMPRESSION / ASSESSMENT AND PLAN / ED COURSE  Pertinent labs & imaging results that were available during my care of the patient were reviewed by me and considered in my medical decision making (see chart for details).  Patient is a 32 year old male complaining of multiple  areas of pain, states he leaned out a shed and now hurts all over, on review of his medical records he has been seen in emergency department several times for pain, last prescription for narcotics was in June 2018, his overdose score is 470, a prescription for meloxicam and baclofen was given, the patient was instructed to apply wet heat followed by ice, he is to follow-up with his regular doctor if not better in 5-7 days, the patient states he understands will comply with instructions, he was discharged in stable condition    As part of my medical decision making, I reviewed the following data within the electronic MEDICAL RECORD NUMBERNotes from prior ED visits, [place] Controlled Substance Database overdose score of 470  ____________________________________________   FINAL CLINICAL IMPRESSION(S) / ED DIAGNOSES  Final diagnoses:  Musculoskeletal pain      NEW MEDICATIONS STARTED DURING THIS VISIT:  This SmartLink is deprecated. Use AVSMEDLIST instead to display the medication list for a patient.   Note:  This document was prepared using Dragon voice recognition software and may include unintentional dictation errors.    Faythe GheeFisher, Matthias Bogus W, PA-C 09/21/17 [id number]    Minna AntisPaduchowski, Kevin,  MD 09/21/17 [phone]

## 2017-10-11 ENCOUNTER — Other Ambulatory Visit: Payer: Self-pay

## 2017-10-11 ENCOUNTER — Ambulatory Visit
Admission: EM | Admit: 2017-10-11 | Discharge: 2017-10-11 | Disposition: A | Discharge status: Home / Self Care | Payer: BLUE CROSS/BLUE SHIELD | Attending: Emergency Medicine | Admitting: Emergency Medicine

## 2017-10-11 DIAGNOSIS — M62830 Muscle spasm of back: Secondary | ICD-10-CM | POA: Diagnosis not present

## 2017-10-11 DIAGNOSIS — S39012D Strain of muscle, fascia and tendon of lower back, subsequent encounter: Secondary | ICD-10-CM | POA: Diagnosis not present

## 2017-10-11 MED ORDER — MELOXICAM 15 MG PO TABS: 15.0000 mg | ORAL_TABLET | Freq: Every day | ORAL | 0 refills | Status: DC

## 2017-10-11 MED ORDER — METHYLPREDNISOLONE 4 MG PO TBPK: ORAL_TABLET | ORAL | 0 refills | Status: DC

## 2017-10-11 MED ORDER — HYDROCODONE-ACETAMINOPHEN 5-325 MG PO TABS: 1.0000 | ORAL_TABLET | Freq: Four times a day (QID) | ORAL | 0 refills | Status: DC | PRN

## 2017-10-11 MED ORDER — METHOCARBAMOL 750 MG PO TABS: 750.0000 mg | ORAL_TABLET | ORAL | 0 refills | Status: DC

## 2017-10-11 NOTE — Discharge Instructions (Signed)
Take the NSAID on a regular basis for the next 10 days.  Mobic with 2 extra strength or 1 g of Tylenol in the morning, then Tylenol or Norco and additional 2-3 times a day. Norco  for severe pain only.  Do not take the Norco if you are taking the Tylenol is too much Tylenol can hurt your liver.  Do not exceed 4 g of Tylenol from all sources in 1 day.  Many people find gentle stretching and deep tissue massage helpful. Follow-up with your primary care physician in several days, go to the ER for the signs and symptoms we discussed.  Go to www.goodrx.com to look up your medications. This will give you a list of where you can find your prescriptions at the most affordable prices. Or ask the pharmacist what the cash price is, or if they have any other discount programs available to help make your medication more affordable. This can be less expensive than what you would pay with insurance.

## 2017-10-11 NOTE — ED Triage Notes (Addendum)
Pt with 2 weeks of low back pain. Non radiating but sometimes hurts worse on right side when stepping a certain way. [facility] and given Baclofen and Mobic and not helping

## 2017-10-11 NOTE — ED Provider Notes (Signed)
HPI  SUBJECTIVE:  Jose Zimmerman is a 32 y.o. male who presents with worsening of his right-sided low back pain 3 days ago after picking up a heavy dresser.  He describes the pain as stabbing, intermittent, lasting hours Per ER records he injured back 20 days ago cleaning out his shed, was seen in the [facility] ER, sent home with Mobic and baclofen.  Symptoms are worse with walking.  Symptoms are better with baclofen, Mobic, heat.  He has also tried Tylenol and Goody's, ibuprofen.  Last dose of antipyretic was yesterday.  He reports nausea with severe pain but denies V, fevers, flank pain, abdominal pain, urinary urgency, frequency, dysuria, cloudy or odorous urine, hematuria.  No syncope. No saddle anesthesia, distal weakness/numbness, bilateral radicular leg pain/weakness, fevers/night sweats, age < 20 or > 50, direct trauma,   bladder/ bowel incontinence, urinary retention, h/o CA / multiple myleoma, unexplained weight loss, pain worse at night,  h/o prolonged steroid use, h/o osteopenia, h/o IVDU, h/o HIV, known AAA.  States feels different than usual back pain. no h/o pyelonephritis, nephrolithiasis.   PMD: Dr. Shella Spearing   Past Medical History:  Diagnosis Date  . Anxiety   . Depression   . GERD (gastroesophageal reflux disease)   . Vertigo     Past Surgical History:  Procedure Laterality Date  . ULNAR NERVE TRANSPOSITION Left 2009    Family History  Problem Relation Age of Onset  . Osteoporosis Mother   . Diabetes Father   . Hypertension Father   . Hyperlipidemia Father   . Crohn's disease Other     Social History   Tobacco Use  . Smoking status: Current Every Day Smoker    Packs/day: 1.00    Years: 10.00    Pack years: 10.00  . Smokeless tobacco: Current User  Substance Use Topics  . Alcohol use: No    Frequency: Never    Comment: quit June 2018  . Drug use: No    No current facility-administered medications for this encounter.   Current Outpatient Medications:  .   HYDROcodone-acetaminophen (NORCO/VICODIN) 5-325 MG tablet, Take 1-2 tablets by mouth every 6 (six) hours as needed for moderate pain or severe pain., Disp: 20 tablet, Rfl: 0 .  meloxicam (MOBIC) 15 MG tablet, Take 1 tablet (15 mg total) by mouth daily., Disp: 30 tablet, Rfl: 0 .  methocarbamol (ROBAXIN) 750 MG tablet, Take 1 tablet (750 mg total) by mouth every 4 (four) hours., Disp: 40 tablet, Rfl: 0 .  methylPREDNISolone (MEDROL DOSEPAK) 4 MG TBPK tablet, follow package directions, Disp: 21 tablet, Rfl: 0 .  omeprazole (PRILOSEC) 20 MG capsule, Take 20 mg by mouth daily., Disp: , Rfl:   Allergies  Allergen Reactions  . Gabapentin Nausea And Vomiting    Drowsiness, confusion  . Keppra [Levetiracetam] Nausea And Vomiting    Drowsiness, confusion     ROS  As noted in HPI.   Physical Exam  BP 135/80 (BP Location: Right Arm)   Pulse 80   Temp 98.4 F (36.9 C) (Oral)   Resp 18   Ht 5\' 9"  (1.753 m)   Wt 160 lb (72.6 kg)   SpO2 100%   BMI 23.63 kg/m   Constitutional: Well developed, well nourished, no acute distress Eyes:  EOMI, conjunctiva normal bilaterally HENT: Normocephalic, atraumatic,mucus membranes moist Respiratory: Normal inspiratory effort Cardiovascular: Normal rate GI: nondistended. No suprapubic tenderness skin: No rash, skin intact Musculoskeletal: no CVAT. + Right sided paralumbar tenderness,  muscle spasm. No  bony tenderness. Bilateral lower extremities nontender without new rashes or color change, baseline ROM with intact DP pulses, aggravated with right hip extension against resistance.  No pain with passive int/ext rotation flex/extension hips bilaterally. SLR positive R side. Sensation baseline light touch bilaterally for Pt, DTR's symmetric and intact bilaterally KJ, Motor symmetric bilateral 5/5 hip flexion, quadriceps, hamstrings, EHL, foot dorsiflexion, foot plantarflexion, gait somewhat antalgic but without apparent new ataxia. Neurologic: Alert & oriented  x 3, no focal neuro deficits Psychiatric: Speech and behavior appropriate   ED Course   Medications - No data to display  No orders of the defined types were placed in this encounter.   No results found for this or any previous visit (from the past 24 hour(s)). No results found.  ED Clinical Impression  Strain of lumbar region, subsequent encounter  Muscle spasm of back   ED Assessment/Plan  [place] Narcotic database reviewed for this patient, and feel that the risk/benefit ratio today is favorable for proceeding with a prescription for controlled substance.  Patient had multiple short opiate prescriptions from multiple providers in 2017.  No narcotics since June 2018.    ER records reviewed.  As noted in HPI.  Dictation consistent with a lumbar strain with muscle spasm.  No evidence of uti, nephrolithiasis.  No evidence of spinal cord involvement based on H&P. Pain has been < 6 week duration. No historical red flags as noted in HPI. No physical red flags such as fever, bony tenderness, lower extremity weakness, saddle anesthesia. Imaging not indicated at this time.    Pt ambulatory in the ED. Home with Meloxicam as states that this helped him, APAP/narcotic, muscle relaxants, steriod. Pt to f/u with PMD.  Discussed labs, medical decision-making, and plan for follow-up with the patient.  Discussed signs and symptoms that should prompt return to the emergency department.  Patient agrees with plan.    Meds ordered this encounter  Medications  . meloxicam (MOBIC) 15 MG tablet    Sig: Take 1 tablet (15 mg total) by mouth daily.    Dispense:  30 tablet    Refill:  0  . HYDROcodone-acetaminophen (NORCO/VICODIN) 5-325 MG tablet    Sig: Take 1-2 tablets by mouth every 6 (six) hours as needed for moderate pain or severe pain.    Dispense:  20 tablet    Refill:  0  . methylPREDNISolone (MEDROL DOSEPAK) 4 MG TBPK tablet    Sig: follow package directions    Dispense:  21 tablet     Refill:  0  . methocarbamol (ROBAXIN) 750 MG tablet    Sig: Take 1 tablet (750 mg total) by mouth every 4 (four) hours.    Dispense:  40 tablet    Refill:  0    *This clinic note was created using Scientist, clinical (histocompatibility and immunogenetics)Dragon dictation software. Therefore, there may be occasional mistakes despite careful proofreading.  ?    Domenick GongMortenson, Kamel Haven, MD 10/11/17 1650

## 2017-10-14 ENCOUNTER — Telehealth: Payer: Self-pay

## 2017-10-14 NOTE — Telephone Encounter (Signed)
Call[facility] to [facility] with patient [facility]. Spoke with pt, reports no improvement, encouraged follow up.  Patient instructed to call back with any questions or concerns. [facility]

## 2017-10-15 ENCOUNTER — Ambulatory Visit (INDEPENDENT_AMBULATORY_CARE_PROVIDER_SITE_OTHER): Payer: BLUE CROSS/BLUE SHIELD

## 2017-10-15 ENCOUNTER — Ambulatory Visit
Admission: EM | Admit: 2017-10-15 | Discharge: 2017-10-15 | Disposition: A | Discharge status: Home / Self Care | Payer: BLUE CROSS/BLUE SHIELD | Attending: Family Medicine | Admitting: Family Medicine

## 2017-10-15 ENCOUNTER — Other Ambulatory Visit: Payer: Self-pay

## 2017-10-15 DIAGNOSIS — M545 Low back pain, unspecified: Secondary | ICD-10-CM

## 2017-10-15 NOTE — ED Provider Notes (Signed)
[facility] ____________________________________________  Time seen: Approximately 3:46 PM  I have reviewed the triage vital signs and the nursing notes.   HISTORY  Chief Complaint Back Pain   HPI Jose Zimmerman is a 32 y.o. male present for reevaluation of right-sided low back pain that is been present for approximately 3 weeks.  Patient reports approximately 3 weeks ago he was cleaning out a building at his house and had to lift several dressers.  Patient reports during the lifting 1 of the dressers, he had onset of right low back pain that "brought him to his knees ".  Denies any fall or direct trauma to his back.  Denies chronic back pain.  Patient reports that he has a very manually strenuous job reports that he was seen for the same complaint this past Sunday and was given prednisone, Mobic, baclofen and Norco.  States that these medicines do somewhat help, but he continues in 9 out of 10 pain, and states that he cannot take the Norco and work as he operates heavy Sales promotion account executivemachinery.  Denies pain radiation, paresthesias, urinary or bowel retention or incontinence, decreased range of motion, decreased strength, fevers, recent sickness, urinary changes or other complaints.  Reports otherwise has been doing well.  He does not currently have a primary care.  States pain increases with walking and stepping hard with his left foot as well as other movements.  States sitting still improves his pain. Denies recent sickness. Denies recent antibiotic use.    Past Medical History:  Diagnosis Date  . Anxiety   . Depression   . GERD (gastroesophageal reflux disease)   . Vertigo     Patient Active Problem List   Diagnosis Date Noted  . Enteritis 09/15/2015  . Depression with anxiety 03/15/2015  . Chronic pain syndrome 03/15/2015  . GERD (gastroesophageal reflux disease) 03/15/2015  . Restless legs syndrome 03/15/2015    Past Surgical History:  Procedure Laterality Date  . ULNAR  NERVE TRANSPOSITION Left 2009     No current facility-administered medications for this encounter.   Current Outpatient Medications:  .  HYDROcodone-acetaminophen (NORCO/VICODIN) 5-325 MG tablet, Take 1-2 tablets by mouth every 6 (six) hours as needed for moderate pain or severe pain., Disp: 20 tablet, Rfl: 0 .  meloxicam (MOBIC) 15 MG tablet, Take 1 tablet (15 mg total) by mouth daily., Disp: 30 tablet, Rfl: 0 .  methocarbamol (ROBAXIN) 750 MG tablet, Take 1 tablet (750 mg total) by mouth every 4 (four) hours., Disp: 40 tablet, Rfl: 0 .  methylPREDNISolone (MEDROL DOSEPAK) 4 MG TBPK tablet, follow package directions, Disp: 21 tablet, Rfl: 0 .  omeprazole (PRILOSEC) 20 MG capsule, Take 20 mg by mouth daily., Disp: , Rfl:   Allergies Gabapentin and Keppra [levetiracetam]  Family History  Problem Relation Age of Onset  . Osteoporosis Mother   . Diabetes Father   . Hypertension Father   . Hyperlipidemia Father   . Crohn's disease Other     Social History Social History   Tobacco Use  . Smoking status: Current Every Day Smoker    Packs/day: 1.00    Years: 10.00    Pack years: 10.00  . Smokeless tobacco: Current User  Substance Use Topics  . Alcohol use: No    Frequency: Never    Comment: quit June 2018  . Drug use: No    Review of Systems Constitutional: No fever/chills Cardiovascular: Denies chest pain. Respiratory: Denies shortness of breath. Gastrointestinal: No abdominal pain.  No nausea,  no vomiting.  No diarrhea.  No constipation. Genitourinary: Negative for dysuria. Musculoskeletal: Positive for back pain. Skin: Negative for rash. Neurological: Negative for headaches, focal weakness or numbness.   ____________________________________________   PHYSICAL EXAM:  VITAL SIGNS: ED Triage Vitals  Enc Vitals Group     BP 10/15/17 1531 138/88     Pulse Rate 10/15/17 1531 89     Resp 10/15/17 1531 18     Temp 10/15/17 1531 98.5 F (36.9 C)     Temp Source  10/15/17 1531 Oral     SpO2 10/15/17 1531 99 %     Weight 10/15/17 1528 160 lb (72.6 kg)     Height 10/15/17 1528 5\' 9"  (1.753 m)     Head Circumference --      Peak Flow --      Pain Score 10/15/17 1528 9     Pain Loc --      Pain Edu? --      Excl. in GC? --     Constitutional: Alert and oriented. Well appearing and in no acute distress. Cardiovascular: Normal rate, regular rhythm. Grossly normal heart sounds.  Good peripheral circulation. Respiratory: Normal respiratory effort without tachypnea nor retractions. Breath sounds are clear and equal bilaterally. No wheezes, rales, rhonchi. Gastrointestinal: Soft and nontender.No CVA tenderness. Musculoskeletal:  Nontender with normal range of motion in all extremities. No midline cervical or thoracic tenderness to palpation. Bilateral pedal pulses equal and easily palpated.      Right lower leg:  No tenderness or edema.      Left lower leg:  No tenderness or edema.  Except: Right lower paralumbar along L2 and L3 mild to moderate tenderness palpation with mild muscular tenderness, minimal midline lumbar tenderness, no ecchymosis, no erythema, no edema noted.  2+ bilateral patellar and Achilles reflexes.  Bilateral plantar flexion and dorsiflexion strong and equal.  No saddle anesthesia.  Pain increases with lumbar flexion extension, no pain change with lumbar rotation, full range of motion present.  Amatory with steady gait and changes positions quickly. Neurologic:  Normal speech and language. No gross focal neurologic deficits are appreciated. Speech is normal. No gait instability.  Skin:  Skin is warm, dry and intact. No rash noted. Psychiatric: Mood and affect are normal. Speech and behavior are normal. Patient exhibits appropriate insight and judgment   ___________________________________________   LABS (all labs ordered are listed, but only abnormal results are displayed)  Labs Reviewed - No data to display  RADIOLOGY  Dg Lumbar  Spine Complete  Result Date: 10/15/2017 CLINICAL DATA:  Recent back strain at work 1 week ago with persistent pain, initial encounter EXAM: LUMBAR SPINE - COMPLETE 4+ VIEW COMPARISON:  None. FINDINGS: Five lumbar type vertebral bodies are well visualized. Vertebral body height is well maintained. No pars defects are noted. No anterolisthesis is seen. No soft tissue changes are noted. Retained fecal material is noted within the colon consistent with a mild degree of constipation. This may be related to the underlying use of narcotic pain relievers. IMPRESSION: Changes of mild constipation.  No acute bony abnormality is noted. Electronically Signed   By: Alcide Clever M.D.   On: 10/15/2017 16:35   ____________________________________________   PROCEDURES Procedures    INITIAL IMPRESSION / ASSESSMENT AND PLAN / ED COURSE  Pertinent labs & imaging results that were available during my care of the patient were reviewed by me and considered in my medical decision making (see chart for details).  Well-appearing patient.  No acute distress.  No focal neurological deficits.  Patient with continuing right lower back pain post lifting injury.  His pain is continued and no previous images, lumbar spine evaluated, changes of mild constipation no acute bony abnormality per radiologist.  Encouraged bowel habits.  Patient to continue current medication regimen, cautioned no pain medication or muscle relaxant while operating machinery.  Encourage PCP establishment and orthopedic follow-up as may need physical therapy.  Encourage rest, fluids, supportive care.   Discussed follow up and return parameters including no resolution or any worsening concerns. Patient verbalized understanding and agreed to plan.   ____________________________________________   FINAL CLINICAL IMPRESSION(S) / ED DIAGNOSES  Final diagnoses:  Acute right-sided low back pain without sciatica     ED Discharge Orders    None        Note: This dictation was prepared with Dragon dictation along with smaller phrase technology. Any transcriptional errors that result from this process are unintentional.         Renford Dills, NP 10/15/17 Rickey Primus

## 2017-10-15 NOTE — Discharge Instructions (Signed)
Continue medications as prescribed.Rest. Drink plenty of fluids.   Establish a primary care for regular follow-up.  Follow-up with orthopedic as discussed.  See above.    Return to Urgent care as needed, proceed to emergency room increased pain, difficulty ambulatory, urinary or bowel changes or worsening concerns.

## 2017-10-15 NOTE — ED Triage Notes (Signed)
Pt treated for lumbar strain on Sunday. Has been taking Medrol Dose pak, Mobic, Norco, and Robaxin without relief. Pain 9/10.

## 2017-10-18 ENCOUNTER — Telehealth: Payer: Self-pay

## 2017-10-18 NOTE — Telephone Encounter (Signed)
Called to follow up with patient since visit here at [facility]. Spoke with pt. Pt. Reports improvement. Patient instructed to call back with any questions or concerns. [facility]  

## 2017-11-10 ENCOUNTER — Other Ambulatory Visit: Payer: Self-pay | Admitting: Orthopedic Surgery

## 2017-11-10 DIAGNOSIS — M545 Low back pain, unspecified: Secondary | ICD-10-CM

## 2017-11-10 DIAGNOSIS — M5416 Radiculopathy, lumbar region: Secondary | ICD-10-CM

## 2017-11-13 ENCOUNTER — Other Ambulatory Visit: Payer: Self-pay | Admitting: Orthopedic Surgery

## 2017-11-13 DIAGNOSIS — Z1389 Encounter for screening for other disorder: Secondary | ICD-10-CM

## 2017-11-14 ENCOUNTER — Encounter: Payer: Self-pay | Admitting: Emergency Medicine

## 2017-11-14 ENCOUNTER — Other Ambulatory Visit: Payer: Self-pay

## 2017-11-14 ENCOUNTER — Emergency Department
Admission: EM | Admit: 2017-11-14 | Discharge: 2017-11-14 | Disposition: A | Discharge status: Home / Self Care | Payer: BLUE CROSS/BLUE SHIELD | Attending: Emergency Medicine | Admitting: Emergency Medicine

## 2017-11-14 DIAGNOSIS — L02216 Cutaneous abscess of umbilicus: Secondary | ICD-10-CM | POA: Insufficient documentation

## 2017-11-14 DIAGNOSIS — L0291 Cutaneous abscess, unspecified: Secondary | ICD-10-CM

## 2017-11-14 MED ORDER — LIDOCAINE HCL 1 % IJ SOLN
5.0000 mL | Freq: Once | INTRAMUSCULAR | Status: AC
  Administered 2017-11-14: 5 mL
  Filled 2017-11-14: qty 5

## 2017-11-14 MED ORDER — CLINDAMYCIN HCL 300 MG PO CAPS: 300.0000 mg | ORAL_CAPSULE | Freq: Three times a day (TID) | ORAL | 0 refills | Status: AC

## 2017-11-14 MED ORDER — LIDOCAINE 5 % EX PTCH
MEDICATED_PATCH | CUTANEOUS | Status: AC
  Filled 2017-11-14: qty 1

## 2017-11-14 MED ORDER — LIDOCAINE HCL (PF) 1 % IJ SOLN
INTRAMUSCULAR | Status: AC
  Administered 2017-11-14: 5 mL
  Filled 2017-11-14: qty 5

## 2017-11-14 NOTE — ED Notes (Signed)
NAD noted at time of D/C. Pt denies questions or concerns. Pt ambulatory to the lobby at this time.  

## 2017-11-14 NOTE — ED Provider Notes (Signed)
[facility] Emergency Department Provider Note  ____________________________________________  Time seen: Approximately 5:27 PM  I have reviewed the triage vital signs and the nursing notes.   HISTORY  Chief Complaint Abscess    HPI Jose Zimmerman is a 32 y.o. male presents to the emergency department with an abscess under chin and a residual abscess to the right of the navel.  Abscess under chin is approximately 2 cm x 2 cm, hard and patient has noticed increasing discomfort.  Patient reports that his wife is attempted self-expression of abscess near her navel, which yielded copious purulent exudate.  Patient reports night sweats but no nausea or vomiting.  He has a history of abscesses in the past with confirmed MRSA carriers within the household.  No fever noted at home.  No alleviating measures have been attempted.   Past Medical History:  Diagnosis Date  . Anxiety   . Depression   . GERD (gastroesophageal reflux disease)   . Vertigo     Patient Active Problem List   Diagnosis Date Noted  . Enteritis 09/15/2015  . Depression with anxiety 03/15/2015  . Chronic pain syndrome 03/15/2015  . GERD (gastroesophageal reflux disease) 03/15/2015  . Restless legs syndrome 03/15/2015    Past Surgical History:  Procedure Laterality Date  . ULNAR NERVE TRANSPOSITION Left 2009    Prior to Admission medications   Medication Sig Start Date End Date Taking? Authorizing Provider  clindamycin (CLEOCIN) 300 MG capsule Take 1 capsule (300 mg total) by mouth 3 (three) times daily for 10 days. 11/14/17 11/24/17  Orvil Feil, PA-C  HYDROcodone-acetaminophen (NORCO/VICODIN) 5-325 MG tablet Take 1-2 tablets by mouth every 6 (six) hours as needed for moderate pain or severe pain. 10/11/17   Domenick Gong, MD  meloxicam (MOBIC) 15 MG tablet Take 1 tablet (15 mg total) by mouth daily. 10/11/17   Domenick Gong, MD  methocarbamol (ROBAXIN) 750 MG tablet Take 1  tablet (750 mg total) by mouth every 4 (four) hours. 10/11/17   Domenick Gong, MD  methylPREDNISolone (MEDROL DOSEPAK) 4 MG TBPK tablet follow package directions 10/11/17   Domenick Gong, MD  omeprazole (PRILOSEC) 20 MG capsule Take 20 mg by mouth daily.    [provider]    Allergies Gabapentin and Keppra [levetiracetam]  Family History  Problem Relation Age of Onset  . Osteoporosis Mother   . Diabetes Father   . Hypertension Father   . Hyperlipidemia Father   . Crohn's disease Other     Social History Social History   Tobacco Use  . Smoking status: Current Every Day Smoker    Packs/day: 1.00    Years: 10.00    Pack years: 10.00  . Smokeless tobacco: Current User  Substance Use Topics  . Alcohol use: No    Frequency: Never    Comment: quit June 2018  . Drug use: No     Review of Systems  Constitutional: No fever/chills Eyes: No visual changes. No discharge ENT: No upper respiratory complaints. Cardiovascular: no chest pain. Respiratory: no cough. No SOB. Gastrointestinal: No abdominal pain.  No nausea, no vomiting.  No diarrhea.  No constipation. Musculoskeletal: Negative for musculoskeletal pain. Skin: No cellulitis.  Patient has cutaneous abscesses. Neurological: Negative for headaches, focal weakness or numbness.  ____________________________________________   PHYSICAL EXAM:  VITAL SIGNS: ED Triage Vitals  Enc Vitals Group     BP 11/14/17 1528 135/88     Pulse Rate 11/14/17 1528 (!) 110  Resp 11/14/17 1528 20     Temp 11/14/17 1528 98.5 F (36.9 C)     Temp Source 11/14/17 1528 Oral     SpO2 11/14/17 1528 100 %     Weight 11/14/17 1531 155 lb (70.3 kg)     Height 11/14/17 1531 5\' 9"  (1.753 m)     Head Circumference --      Peak Flow --      Pain Score 11/14/17 1531 7     Pain Loc --      Pain Edu? --      Excl. in GC? --      Constitutional: Alert and oriented. Well appearing and in no acute distress. Eyes: Conjunctivae  are normal. PERRL. EOMI. Head: Atraumatic. Hematological/Lymphatic/Immunilogical: No cervical lymphadenopathy. Cardiovascular: Normal rate, regular rhythm. Normal S1 and S2.  Good peripheral circulation. Respiratory: Normal respiratory effort without tachypnea or retractions. Lungs CTAB. Good air entry to the bases with no decreased or absent breath sounds. Gastrointestinal: Bowel sounds 4 quadrants. Soft and nontender to palpation. No guarding or rigidity. No palpable masses. No distention. No CVA tenderness. Musculoskeletal: Full range of motion to all extremities. No gross deformities appreciated. Neurologic:  Normal speech and language. No gross focal neurologic deficits are appreciated.  Skin: Patient has a 2 cm x 2 cm palpable mass along the right neck beneath the chin.  Mass is hard and immobile without surrounding cellulitis.  Patient also has a residual abscess adjacent to the navel on the right side.  Also no surrounding cellulitis. Psychiatric: Mood and affect are normal. Speech and behavior are normal. Patient exhibits appropriate insight and judgement.   ____________________________________________   LABS (all labs ordered are listed, but only abnormal results are displayed)  Labs Reviewed - No data to display ____________________________________________  EKG   ____________________________________________  RADIOLOGY   No results found.  ____________________________________________    PROCEDURES  Procedure(s) performed:    Procedures  INCISION AND DRAINAGE Performed by: Orvil Feil Consent: Verbal consent obtained. Risks and benefits: risks, benefits and alternatives were discussed Type: abscess  Body area: Right navel   Anesthesia: local infiltration  Incision was made with a scalpel.  Local anesthetic: lidocaine 1% without epinephrine  Anesthetic total: 2 ml  Complexity: complex Blunt dissection to break up loculations  Drainage:  purulent  Drainage amount: Small   Packing material: None   Patient tolerance: Patient tolerated the procedure well with no immediate complications.     Medications  lidocaine (LIDODERM) 5 % (not administered)  lidocaine (XYLOCAINE) 1 % (with pres) injection 5 mL (5 mLs Infiltration Given by Other 11/14/17 1700)     ____________________________________________   INITIAL IMPRESSION / ASSESSMENT AND PLAN / ED COURSE  Pertinent labs & imaging results that were available during my care of the patient were reviewed by me and considered in my medical decision making (see chart for details).  Review of the [place] CSRS was performed in accordance of the [facility] prior to dispensing any controlled drugs.     Assessment and Plan:  Abscess Patient presents to the emergency department with a residual abscess near her navel and a 2 cm x 2 cm mass along the neck.  I advised patient to have a CT of the neck and he refused AGAINST MEDICAL ADVICE.  Patient was advised to follow-up with primary care to schedule an elective CT.  Incision and drainage was performed on residual abscess and patient was discharged with clindamycin.  All patient questions were answered.  ____________________________________________  FINAL CLINICAL IMPRESSION(S) / ED DIAGNOSES  Final diagnoses:  Abscess      NEW MEDICATIONS STARTED DURING THIS VISIT:  ED Discharge Orders        Ordered    clindamycin (CLEOCIN) 300 MG capsule  3 times daily     11/14/17 1712          This chart was dictated using voice recognition software/Dragon. Despite best efforts to proofread, errors can occur which can change the meaning. Any change was purely unintentional.    Orvil FeilWoods, Najah Liverman M, PA-C 11/14/17 1739    Jene EveryKinner, Robert, MD 11/14/17 [phone]

## 2017-11-14 NOTE — ED Triage Notes (Signed)
States cyst under skin R jaw and on abdomen. States has had them for over a month.

## 2017-11-14 NOTE — ED Notes (Signed)
Pt reports having abscess to R jaw and on abdomen x1 month.  Pt ambulatory from triage to room.  Pt A&Ox4, in NAD.

## 2017-11-26 ENCOUNTER — Ambulatory Visit
Admission: RE | Admit: 2017-11-26 | Discharge: 2017-11-26 | Disposition: A | Discharge status: Home / Self Care | Payer: BLUE CROSS/BLUE SHIELD | Source: Ambulatory Visit | Attending: Orthopedic Surgery | Admitting: Orthopedic Surgery

## 2017-11-26 DIAGNOSIS — M5416 Radiculopathy, lumbar region: Secondary | ICD-10-CM | POA: Insufficient documentation

## 2017-11-26 DIAGNOSIS — Z1389 Encounter for screening for other disorder: Secondary | ICD-10-CM | POA: Insufficient documentation

## 2017-11-26 DIAGNOSIS — M545 Low back pain, unspecified: Secondary | ICD-10-CM

## 2017-12-22 ENCOUNTER — Other Ambulatory Visit: Payer: Self-pay

## 2017-12-22 ENCOUNTER — Emergency Department: Payer: BLUE CROSS/BLUE SHIELD

## 2017-12-22 ENCOUNTER — Encounter: Payer: Self-pay | Admitting: Emergency Medicine

## 2017-12-22 ENCOUNTER — Ambulatory Visit (INDEPENDENT_AMBULATORY_CARE_PROVIDER_SITE_OTHER)
Admission: EM | Admit: 2017-12-22 | Discharge: 2017-12-22 | Disposition: A | Discharge status: Other Acute Inpatient Hospital | Payer: BLUE CROSS/BLUE SHIELD | Source: Home / Self Care | Attending: Family Medicine | Admitting: Family Medicine

## 2017-12-22 ENCOUNTER — Emergency Department
Admission: EM | Admit: 2017-12-22 | Discharge: 2017-12-22 | Disposition: A | Discharge status: Home / Self Care | Payer: BLUE CROSS/BLUE SHIELD | Attending: Emergency Medicine | Admitting: Emergency Medicine

## 2017-12-22 DIAGNOSIS — F418 Other specified anxiety disorders: Secondary | ICD-10-CM

## 2017-12-22 DIAGNOSIS — Z79899 Other long term (current) drug therapy: Secondary | ICD-10-CM

## 2017-12-22 DIAGNOSIS — M549 Dorsalgia, unspecified: Secondary | ICD-10-CM

## 2017-12-22 DIAGNOSIS — Z888 Allergy status to other drugs, medicaments and biological substances status: Secondary | ICD-10-CM

## 2017-12-22 DIAGNOSIS — F17210 Nicotine dependence, cigarettes, uncomplicated: Secondary | ICD-10-CM

## 2017-12-22 DIAGNOSIS — R0789 Other chest pain: Secondary | ICD-10-CM | POA: Diagnosis not present

## 2017-12-22 DIAGNOSIS — R6884 Jaw pain: Secondary | ICD-10-CM | POA: Diagnosis not present

## 2017-12-22 DIAGNOSIS — K0889 Other specified disorders of teeth and supporting structures: Secondary | ICD-10-CM | POA: Diagnosis not present

## 2017-12-22 DIAGNOSIS — F17290 Nicotine dependence, other tobacco product, uncomplicated: Secondary | ICD-10-CM | POA: Diagnosis not present

## 2017-12-22 DIAGNOSIS — R1010 Upper abdominal pain, unspecified: Secondary | ICD-10-CM | POA: Diagnosis not present

## 2017-12-22 DIAGNOSIS — K219 Gastro-esophageal reflux disease without esophagitis: Secondary | ICD-10-CM

## 2017-12-22 DIAGNOSIS — G894 Chronic pain syndrome: Secondary | ICD-10-CM

## 2017-12-22 DIAGNOSIS — R079 Chest pain, unspecified: Secondary | ICD-10-CM | POA: Diagnosis not present

## 2017-12-22 DIAGNOSIS — G2581 Restless legs syndrome: Secondary | ICD-10-CM | POA: Insufficient documentation

## 2017-12-22 DIAGNOSIS — R61 Generalized hyperhidrosis: Secondary | ICD-10-CM

## 2017-12-22 DIAGNOSIS — R110 Nausea: Secondary | ICD-10-CM | POA: Insufficient documentation

## 2017-12-22 LAB — BASIC METABOLIC PANEL
Anion gap: 7 (ref 5–15)
BUN: 13 mg/dL (ref 6–20)
CALCIUM: 9.1 mg/dL (ref 8.9–10.3)
CHLORIDE: 108 mmol/L (ref 101–111)
CO2: 25 mmol/L (ref 22–32)
CREATININE: 0.6 mg/dL — AB (ref 0.61–1.24)
GFR calc Af Amer: >60 mL/min (ref >60)
GFR calc non Af Amer: >60 mL/min (ref >60)
Glucose, Bld: 118 mg/dL — ABNORMAL HIGH (ref 65–99)
Potassium: 4.2 mmol/L (ref 3.5–5.1)
Sodium: 140 mmol/L (ref 135–145)

## 2017-12-22 LAB — CBC
HCT: 44.1 % (ref 40.0–52.0)
Hemoglobin: 14.7 g/dL (ref 13.0–18.0)
MCH: 30.5 pg (ref 26.0–34.0)
MCHC: 33.4 g/dL (ref 32.0–36.0)
MCV: 91.3 fL (ref 80.0–100.0)
PLATELETS: 203 10*3/uL (ref 150–440)
RBC: 4.83 MIL/uL (ref 4.40–5.90)
RDW: 14.4 % (ref 11.5–14.5)
WBC: 13.4 10*3/uL — ABNORMAL HIGH (ref 3.8–10.6)

## 2017-12-22 LAB — TROPONIN I: Troponin I: <0.03 ng/mL (ref <0.03)

## 2017-12-22 MED ORDER — ASPIRIN 81 MG PO CHEW
324.0000 mg | CHEWABLE_TABLET | Freq: Once | ORAL | Status: AC
  Administered 2017-12-22: 324 mg via ORAL

## 2017-12-22 MED ORDER — LORAZEPAM 1 MG PO TABS: 1.0000 mg | ORAL_TABLET | Freq: Two times a day (BID) | ORAL | 0 refills | Status: DC

## 2017-12-22 MED ORDER — MORPHINE SULFATE (PF) 4 MG/ML IV SOLN
4.0000 mg | Freq: Once | INTRAVENOUS | Status: AC
  Administered 2017-12-22: 4 mg via INTRAVENOUS
  Filled 2017-12-22: qty 1

## 2017-12-22 NOTE — ED Provider Notes (Signed)
[facility] Emergency Department Provider Note       Time seen: ----------------------------------------- 10:12 AM on 12/22/2017 -----------------------------------------   I have reviewed the triage vital signs and the nursing notes.  HISTORY   Chief Complaint Chest Pain    HPI Jose Zimmerman is a 32 y.o. male with a history of anxiety, depression, GERD and vertigo who presents to the ED for pain that radiates across his chest that started this morning.  He has had some jaw pain as well which he states is from a tooth infection.  He is currently on antibiotics for same.  Patient states during this chest pain he also had some upper abdominal pain, and nausea.  He denies a history of this before, denies any risk factors for heart disease.  He was given aspirin and nitroglycerin in route without improvement in his symptoms.  Past Medical History:  Diagnosis Date  . Anxiety   . Depression   . GERD (gastroesophageal reflux disease)   . Vertigo     Patient Active Problem List   Diagnosis Date Noted  . Enteritis 09/15/2015  . Depression with anxiety 03/15/2015  . Chronic pain syndrome 03/15/2015  . GERD (gastroesophageal reflux disease) 03/15/2015  . Restless legs syndrome 03/15/2015    Past Surgical History:  Procedure Laterality Date  . ULNAR NERVE TRANSPOSITION Left 2009    Allergies Gabapentin and Keppra [levetiracetam]  Social History Social History   Tobacco Use  . Smoking status: Current Every Day Smoker    Packs/day: 1.00    Years: 10.00    Pack years: 10.00  . Smokeless tobacco: Current User  Substance Use Topics  . Alcohol use: Yes    Frequency: Never    Comment: quit June 2018  . Drug use: No   Review of Systems Constitutional: Negative for fever. ENT: Positive for toothache Cardiovascular: Positive for chest pain Respiratory: Negative for shortness of breath. Gastrointestinal: Negative for abdominal pain, positive for  nausea Musculoskeletal: Positive for back pain Skin: Negative for rash. Neurological: Negative for headaches, focal weakness or numbness.  All systems negative/normal/unremarkable except as stated in the HPI  ____________________________________________   PHYSICAL EXAM:  VITAL SIGNS: ED Triage Vitals  Enc Vitals Group     BP      Pulse      Resp      Temp      Temp src      SpO2      Weight      Height      Head Circumference      Peak Flow      Pain Score      Pain Loc      Pain Edu?      Excl. in GC?    Constitutional: Alert and oriented. Well appearing and in no distress. Eyes: Conjunctivae are normal. Normal extraocular movements. ENT   Head: Normocephalic and atraumatic.   Nose: No congestion/rhinnorhea.   Mouth/Throat: Mucous membranes are moist.   Neck: No stridor. Cardiovascular: Normal rate, regular rhythm. No murmurs, rubs, or gallops. Respiratory: Normal respiratory effort without tachypnea nor retractions. Breath sounds are clear and equal bilaterally. No wheezes/rales/rhonchi. Gastrointestinal: Soft and nontender. Normal bowel sounds Musculoskeletal: Nontender with normal range of motion in extremities. No lower extremity tenderness nor edema. Neurologic:  Normal speech and language. No gross focal neurologic deficits are appreciated.  Skin:  Skin is warm, dry and intact. No rash noted. Psychiatric: Mood and affect are normal. Speech and  behavior are normal.  ____________________________________________  EKG: Interpreted by me.  Sinus rhythm the rate of 98 bpm, normal PR interval, normal QRS, normal QT.  ____________________________________________  ED COURSE:  As part of my medical decision making, I reviewed the following data within the electronic MEDICAL RECORD NUMBER History obtained from family if available, nursing notes, old chart and ekg, as well as notes from prior ED visits. Patient presented for chest pain, we will assess with labs  and imaging as indicated at this time.   Procedures ____________________________________________   LABS (pertinent positives/negatives)  Labs Reviewed  BASIC METABOLIC PANEL - Abnormal; Notable for the following components:      Result Value   Glucose, Bld 118 (*)    Creatinine, Ser 0.60 (*)    All other components within normal limits  CBC - Abnormal; Notable for the following components:   WBC 13.4 (*)    All other components within normal limits  TROPONIN I  TROPONIN I  URINE DRUG SCREEN, QUALITATIVE ([facility] ONLY)    RADIOLOGY  Chest x-ray is unremarkable  ____________________________________________  DIFFERENTIAL DIAGNOSIS   Musculoskeletal pain, GERD, chronic pain, anxiety, unstable angina  FINAL ASSESSMENT AND PLAN  Chest pain   Plan: The patient had presented for chest pain and toothache. Patient's labs are reassuring. Patient's imaging also reassuring.  Pain is likely unrelated to cardiovascular disease.  He will be discharged with symptomatic treatment and he is cleared for outpatient follow-up.   Ulice DashJohnathan E Shaqueta Casady, MD   Note: This note was generated in part or whole with voice recognition software. Voice recognition is usually quite accurate but there are transcription errors that can and very often do occur. I apologize for any typographical errors that were not detected and corrected.     Emily FilbertWilliams, Hermena Swint E, MD 12/22/17 [phone]

## 2017-12-22 NOTE — ED Triage Notes (Signed)
Patient states he woke with facial pain and chest pressure. States he is shaking and "feels like he' going to fall out"

## 2017-12-22 NOTE — ED Triage Notes (Addendum)
[facility] with c/o chest pressure since 0600 today. PT A&OX4, pt diaphoretic. PT given 1 nitro in route with no improvement, and 324mg  of Asprin. VSS MD at bedside

## 2017-12-22 NOTE — ED Provider Notes (Signed)
[facility]    CSN: [id number] Arrival date & time: 12/22/17  [id number]  History   Chief Complaint Chief Complaint  Patient presents with  . Chest Pain   HPI  32 year old male presents with chest pressure.  Patient states that it started this morning.  He feels very poorly.  He states that he does not feel right.  He reports chest pressure, severe low back pain, and left jaw pain.  He also states that he feels "hot and cold".  Symptoms are severe.  He is very uncomfortable.  He states that he is in a great deal of pain.  No medications or interventions tried.  No known relieving factors.  No other reported symptoms.  No other complaints or concerns at this time.  Of note, his back pain is chronic.  Regarding his jaw pain, this is likely dental in origin as he has poor dentition.  Past Medical History:  Diagnosis Date  . Anxiety   . Depression   . GERD (gastroesophageal reflux disease)   . Vertigo    Patient Active Problem List   Diagnosis Date Noted  . Enteritis 09/15/2015  . Depression with anxiety 03/15/2015  . Chronic pain syndrome 03/15/2015  . GERD (gastroesophageal reflux disease) 03/15/2015  . Restless legs syndrome 03/15/2015   Past Surgical History:  Procedure Laterality Date  . ULNAR NERVE TRANSPOSITION Left 2009   Home Medications    Prior to Admission medications   Medication Sig Start Date End Date Taking? Authorizing Provider  HYDROcodone-acetaminophen (NORCO/VICODIN) 5-325 MG tablet Take 1-2 tablets by mouth every 6 (six) hours as needed for moderate pain or severe pain. 10/11/17   Domenick GongMortenson, Ashley, MD  meloxicam (MOBIC) 15 MG tablet Take 1 tablet (15 mg total) by mouth daily. 10/11/17   Domenick GongMortenson, Ashley, MD  methocarbamol (ROBAXIN) 750 MG tablet Take 1 tablet (750 mg total) by mouth every 4 (four) hours. 10/11/17   Domenick GongMortenson, Ashley, MD  methylPREDNISolone (MEDROL DOSEPAK) 4 MG TBPK tablet follow package directions 10/11/17   Domenick GongMortenson, Ashley, MD    omeprazole (PRILOSEC) 20 MG capsule Take 20 mg by mouth daily.    [provider]    Family History Family History  Problem Relation Age of Onset  . Osteoporosis Mother   . Diabetes Father   . Hypertension Father   . Hyperlipidemia Father   . Crohn's disease Other     Social History Social History   Tobacco Use  . Smoking status: Current Every Day Smoker    Packs/day: 1.00    Years: 10.00    Pack years: 10.00  . Smokeless tobacco: Current User  Substance Use Topics  . Alcohol use: Yes    Frequency: Never    Comment: quit June 2018  . Drug use: No     Allergies   Gabapentin and Keppra [levetiracetam]   Review of Systems Review of Systems  Constitutional: Positive for diaphoresis.  HENT: Positive for dental problem.   Cardiovascular: Positive for chest pain.  Musculoskeletal: Positive for back pain.   Physical Exam Triage Vital Signs ED Triage Vitals  Enc Vitals Group     BP 12/22/17 0844 (!) 145/98     Pulse Rate 12/22/17 0844 (!) 111     Resp 12/22/17 0844 18     Temp 12/22/17 0844 98.5 F (36.9 C)     Temp src --      SpO2 12/22/17 0844 100 %     Weight 12/22/17 0846 150 lb (68 kg)  Height 12/22/17 0846 5\' 9"  (1.753 m)     Head Circumference --      Peak Flow --      Pain Score 12/22/17 0845 10     Pain Loc --      Pain Edu? --      Excl. in GC? --    Updated Vital Signs BP (!) 145/98 (BP Location: Right Arm)   Pulse (!) 111   Temp 98.5 F (36.9 C)   Resp 18   Ht 5\' 9"  (1.753 m)   Wt 150 lb (68 kg)   SpO2 100%   BMI 22.15 kg/m   Physical Exam  Constitutional: He is oriented to person, place, and time. He appears well-developed.  Appears anxious and distressed.  HENT:  Head: Normocephalic and atraumatic.  Patient has multiple dental caries/eroding teeth.  Cardiovascular: Normal rate and regular rhythm.  Pulmonary/Chest: Effort normal and breath sounds normal. He has no wheezes. He has no rales.  Abdominal: Soft. He  exhibits no distension. There is no tenderness. There is no rebound and no guarding.  Neurological: He is alert and oriented to person, place, and time.  Psychiatric: His behavior is normal.  Anxious.   Nursing note and vitals reviewed.  UC Treatments / Results  Labs (all labs ordered are listed, but only abnormal results are displayed) Labs Reviewed - No data to display  EKG  Date: 12/22/2017  EKG Time: 9:23 AM  Rate: 101  Rhythm: Sinus Tachycardia.  Axis: Normal.  Intervals: Normal intervals.  ST&T Change: No ST or T wave changes.  Narrative Interpretation: Sinus tachycardia at the rate of 101.  Normal axis.  Normal intervals.  Radiology No results found.  Procedures Procedures (including critical care time)  Medications Ordered in UC Medications  aspirin chewable tablet 324 mg (324 mg Oral Given 12/22/17 0908)   Initial Impression / Assessment and Plan / UC Course  I have reviewed the triage vital signs and the nursing notes.  Pertinent labs & imaging results that were available during my care of the patient were reviewed by me and considered in my medical decision making (see chart for details).    32 year old male presents with chest pain, back pain, and left jaw pain.  EKG is nonischemic.  Patient appears distressed and will therefore sent to the hospital for labs and further monitoring.  His back pain is chronic and his left jaw pain is dental in origin (this is not from a cardiac origin).   Final Clinical Impressions(s) / UC Diagnoses   Final diagnoses:  Atypical chest pain    ED Discharge Orders    None     Controlled Substance Prescriptions [place] Controlled Substance Registry consulted? Not Applicable   Tommie Sams, [place] 12/22/17 [id number]

## 2017-12-22 NOTE — ED Triage Notes (Signed)
22 IV started to left arm . Good blood return and flushed well. Pt tolerated it well.

## 2018-01-21 ENCOUNTER — Ambulatory Visit: Payer: BLUE CROSS/BLUE SHIELD | Admitting: Student in an Organized Health Care Education/Training Program

## 2018-03-25 ENCOUNTER — Ambulatory Visit
Admission: EM | Admit: 2018-03-25 | Discharge: 2018-03-25 | Disposition: A | Discharge status: Home / Self Care | Payer: Self-pay | Attending: Family Medicine | Admitting: Family Medicine

## 2018-03-25 ENCOUNTER — Encounter: Payer: Self-pay | Admitting: Emergency Medicine

## 2018-03-25 ENCOUNTER — Other Ambulatory Visit: Payer: Self-pay

## 2018-03-25 ENCOUNTER — Ambulatory Visit (INDEPENDENT_AMBULATORY_CARE_PROVIDER_SITE_OTHER): Payer: Self-pay

## 2018-03-25 DIAGNOSIS — R109 Unspecified abdominal pain: Secondary | ICD-10-CM

## 2018-03-25 DIAGNOSIS — R110 Nausea: Secondary | ICD-10-CM

## 2018-03-25 DIAGNOSIS — R071 Chest pain on breathing: Secondary | ICD-10-CM

## 2018-03-25 DIAGNOSIS — R0789 Other chest pain: Secondary | ICD-10-CM

## 2018-03-25 LAB — URINALYSIS, COMPLETE (UACMP) WITH MICROSCOPIC
Bacteria, UA: NONE SEEN
Glucose, UA: NEGATIVE mg/dL
Hgb urine dipstick: NEGATIVE
Leukocytes, UA: NEGATIVE
Nitrite: NEGATIVE
Protein, ur: 30 mg/dL — AB
Specific Gravity, Urine: 1.02 (ref 1.005–1.030)
Squamous Epithelial / LPF: NONE SEEN (ref 0–5)
pH: 7 (ref 5.0–8.0)

## 2018-03-25 MED ORDER — NAPROXEN 500 MG PO TABS: 500.0000 mg | ORAL_TABLET | Freq: Two times a day (BID) | ORAL | 0 refills | Status: DC

## 2018-03-25 MED ORDER — TIZANIDINE HCL 4 MG PO CAPS: 4.0000 mg | ORAL_CAPSULE | Freq: Three times a day (TID) | ORAL | 0 refills | Status: DC

## 2018-03-25 MED ORDER — KETOROLAC TROMETHAMINE 60 MG/2ML IM SOLN
60.0000 mg | Freq: Once | INTRAMUSCULAR | Status: AC
  Administered 2018-03-25: 60 mg via INTRAMUSCULAR

## 2018-03-25 NOTE — ED Triage Notes (Signed)
Patient in today c/o right sided flank pain and nausea since last night. Patient denies urinary frequency, pain or discharge.  Also, patient c/o left upper rib pain x 2 days. Patient states he was putting on heavy tires and heard a pop.

## 2018-03-25 NOTE — ED Provider Notes (Signed)
[facility]    CSN: [id number] Arrival date & time: 03/25/18  1109     History   Chief Complaint Chief Complaint  Patient presents with  . Flank Pain    right    HPI Jose Zimmerman is a 32 y.o. male.   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  He has had no urinary frequency pain or discharge.  He also has left upper rib pain which is in the anterior axillary line that has had for 2 days after lifting a heavy truck tire at work.  When he lifted a tire he felt a pop in that area and has been bothering him since then.  He does state that the pain is worse with deep inspiration and certain motions.        Past Medical History:  Diagnosis Date  . Anxiety   . Depression   . GERD (gastroesophageal reflux disease)   . Vertigo     Patient Active Problem List   Diagnosis Date Noted  . Enteritis 09/15/2015  . Depression with anxiety 03/15/2015  . Chronic pain syndrome 03/15/2015  . GERD (gastroesophageal reflux disease) 03/15/2015  . Restless legs syndrome 03/15/2015    Past Surgical History:  Procedure Laterality Date  . ULNAR NERVE TRANSPOSITION Left 2009       Home Medications    Prior to Admission medications   Medication Sig Start Date End Date Taking? Authorizing Provider  Aspirin-Acetaminophen-Caffeine (GOODYS EXTRA STRENGTH) [phone] MG PACK Take 1 packet by mouth 2 (two) times daily between meals as needed.   Yes [provider]  naproxen (NAPROSYN) 500 MG tablet Take 1 tablet (500 mg total) by mouth 2 (two) times daily with a meal. 03/25/18   Lutricia Feil, PA-C  tiZANidine (ZANAFLEX) 4 MG capsule Take 1 capsule (4 mg total) by mouth 3 (three) times daily. 03/25/18   Lutricia Feil, PA-C    Family History Family History  Problem Relation Age of Onset  . Osteoporosis Mother   . Diabetes Father   . Hypertension Father   . Hyperlipidemia Father     . Crohn's disease Other     Social History Social History   Tobacco Use  . Smoking status: Current Every Day Smoker    Packs/day: 0.50    Years: 10.00    Pack years: 5.00    Types: Cigarettes  . Smokeless tobacco: Current User  Substance Use Topics  . Alcohol use: Not Currently    Frequency: Never    Comment: quit June 2018  . Drug use: No     Allergies   Gabapentin and Keppra [levetiracetam]   Review of Systems Review of Systems  Constitutional: Positive for activity change. Negative for appetite change, chills, fatigue and fever.  Cardiovascular: Positive for chest pain.  All other systems reviewed and are negative.    Physical Exam Triage Vital Signs ED Triage Vitals  Enc Vitals Group     BP 03/25/18 1126 123/86     Pulse Rate 03/25/18 1126 82     Resp 03/25/18 1126 16     Temp 03/25/18 1126 98.3 F (36.8 C)     Temp Source 03/25/18 1126 Oral     SpO2 03/25/18 1126 100 %     Weight 03/25/18 1126 155 lb (70.3 kg)     Height 03/25/18 1126 5\' 9"  (1.753 m)     Head Circumference --  Peak Flow --      Pain Score 03/25/18 1125 8     Pain Loc --      Pain Edu? --      Excl. in GC? --    No data found.  Updated Vital Signs BP 123/86 (BP Location: Left Arm)   Pulse 82   Temp 98.3 F (36.8 C) (Oral)   Resp 16   Ht 5\' 9"  (1.753 m)   Wt 155 lb (70.3 kg)   SpO2 100%   BMI 22.89 kg/m   Visual Acuity Right Eye Distance:   Left Eye Distance:   Bilateral Distance:    Right Eye Near:   Left Eye Near:    Bilateral Near:     Physical Exam  Constitutional: He is oriented to person, place, and time. He appears well-developed and well-nourished. No distress.  HENT:  Head: Normocephalic.  Eyes: Pupils are equal, round, and reactive to light. Right eye exhibits no discharge. Left eye exhibits no discharge.  Neck: Normal range of motion.  Pulmonary/Chest: Effort normal and breath sounds normal.  Examination of the chest shows point tenderness over  left rib cage in the anterior axillary line at approximately level 7 8.  Also is tender over the lower ribs on the right in the posterior axillary line at approximately rib 1011.  Musculoskeletal: Normal range of motion. He exhibits tenderness.  Exammanation of the ribs on the left shows tenderness over rib 8 in the anterior axillary line reproducing his symptoms.  Thoracic rotation increases his discomfort reproducing his symptoms.  He has corresponding pain over the right ribs in the posterior axillary line on ribs 1011.  Worsened with deep inspiration or with motion.  Neurological: He is alert and oriented to person, place, and time.  Skin: Skin is warm and dry. He is not diaphoretic.  Psychiatric: He has a normal mood and affect. His behavior is normal. Judgment and thought content normal.  Nursing note and vitals reviewed.    UC Treatments / Results  Labs (all labs ordered are listed, but only abnormal results are displayed) Labs Reviewed  URINALYSIS, COMPLETE (UACMP) WITH MICROSCOPIC - Abnormal; Notable for the following components:      Result Value   Bilirubin Urine SMALL (*)    Ketones, ur TRACE (*)    Protein, ur 30 (*)    All other components within normal limits    EKG None  Radiology Dg Ribs Unilateral W/chest Left  Result Date: 03/25/2018 CLINICAL DATA:  Pain with palpation and pain with breathing, LEFT lateral rib area. Injury 3 days ago. EXAM: LEFT RIBS AND CHEST - 3+ VIEW COMPARISON:  Chest x-ray dated 12/22/2017. FINDINGS: Single-view of the chest and four views of the LEFT ribs are provided. Heart size is normal. Lungs are clear. No pleural effusion or pneumothorax seen. Osseous structures about the chest are unremarkable. No LEFT-sided rib fracture or displacement seen. IMPRESSION: Negative. Electronically Signed   By: Bary RichardStan  Maynard M.D.   On: 03/25/2018 12:31    Procedures Procedures (including critical care time)  Medications Ordered in UC Medications   ketorolac (TORADOL) injection 60 mg (60 mg Intramuscular Given 03/25/18 1259)    Initial Impression / Assessment and Plan / UC Course  I have reviewed the triage vital signs and the nursing notes.  Pertinent labs & imaging results that were available during my care of the patient were reviewed by me and considered in my medical decision making (see chart for details).  Plan: 1. Test/x-ray results and diagnosis reviewed with patient 2. rx as per orders; risks, benefits, potential side effects reviewed with patient 3. Recommend supportive treatment with frequent cough and deep breathing to prevent pneumonia or pneumonitis.  Use ice and training with heat for comfort.  Caution regarding the use of muscle relaxers was given to the patient.  If he is not improving he should follow-up with her primary care physician or may return to our clinic. 4. F/u prn if symptoms worsen or don't improve  Final Clinical Impressions(s) / UC Diagnoses   Final diagnoses:  Costochondral chest pain     Discharge Instructions     Apply ice 20 minutes out of every 2 hours 4-5 times daily for comfort. Use  Caution while taking muscle relaxers.  Do not perform activities requiring concentration or judgment and do not drive. Be Sure to cough and deep breathe frequently to prevent pneumonia or pneumonitis    ED Prescriptions    Medication Sig Dispense Auth. Provider   naproxen (NAPROSYN) 500 MG tablet Take 1 tablet (500 mg total) by mouth 2 (two) times daily with a meal. 60 tablet Ovid Curd P, PA-C   tiZANidine (ZANAFLEX) 4 MG capsule Take 1 capsule (4 mg total) by mouth 3 (three) times daily. 21 capsule Lutricia Feil, PA-C     Controlled Substance Prescriptions [place] Controlled Substance Registry consulted? Not Applicable   Lutricia Feil, PA-C 03/25/18 1451

## 2018-03-25 NOTE — Discharge Instructions (Signed)
Apply ice 20 minutes out of every 2 hours 4-5 times daily for comfort. Use  Caution while taking muscle relaxers.  Do not perform activities requiring concentration or judgment and do not drive. Be Sure to cough and deep breathe frequently to prevent pneumonia or pneumonitis

## 2018-06-07 ENCOUNTER — Emergency Department
Admission: EM | Admit: 2018-06-07 | Discharge: 2018-06-07 | Disposition: A | Discharge status: Home / Self Care | Payer: Self-pay | Attending: Emergency Medicine | Admitting: Emergency Medicine

## 2018-06-07 ENCOUNTER — Emergency Department: Payer: Self-pay

## 2018-06-07 ENCOUNTER — Other Ambulatory Visit: Payer: Self-pay

## 2018-06-07 ENCOUNTER — Encounter: Payer: Self-pay | Admitting: Emergency Medicine

## 2018-06-07 DIAGNOSIS — R0602 Shortness of breath: Secondary | ICD-10-CM | POA: Insufficient documentation

## 2018-06-07 DIAGNOSIS — F419 Anxiety disorder, unspecified: Secondary | ICD-10-CM | POA: Insufficient documentation

## 2018-06-07 DIAGNOSIS — F17228 Nicotine dependence, chewing tobacco, with other nicotine-induced disorders: Secondary | ICD-10-CM | POA: Insufficient documentation

## 2018-06-07 DIAGNOSIS — R0789 Other chest pain: Secondary | ICD-10-CM | POA: Insufficient documentation

## 2018-06-07 DIAGNOSIS — R112 Nausea with vomiting, unspecified: Secondary | ICD-10-CM | POA: Insufficient documentation

## 2018-06-07 DIAGNOSIS — F17210 Nicotine dependence, cigarettes, uncomplicated: Secondary | ICD-10-CM | POA: Insufficient documentation

## 2018-06-07 DIAGNOSIS — R61 Generalized hyperhidrosis: Secondary | ICD-10-CM | POA: Insufficient documentation

## 2018-06-07 LAB — COMPREHENSIVE METABOLIC PANEL
ALBUMIN: 4.6 g/dL (ref 3.5–5.0)
ALT: 19 U/L (ref 0–44)
ANION GAP: 8 (ref 5–15)
AST: 20 U/L (ref 15–41)
Alkaline Phosphatase: 74 U/L (ref 38–126)
BILIRUBIN TOTAL: 0.4 mg/dL (ref 0.3–1.2)
BUN: 14 mg/dL (ref 6–20)
CO2: 26 mmol/L (ref 22–32)
Calcium: 9.5 mg/dL (ref 8.9–10.3)
Chloride: 106 mmol/L (ref 98–111)
Creatinine, Ser: 0.71 mg/dL (ref 0.61–1.24)
GFR calc Af Amer: >60 mL/min (ref >60)
GFR calc non Af Amer: >60 mL/min (ref >60)
GLUCOSE: 87 mg/dL (ref 70–99)
POTASSIUM: 3.7 mmol/L (ref 3.5–5.1)
SODIUM: 140 mmol/L (ref 135–145)
Total Protein: 7.2 g/dL (ref 6.5–8.1)

## 2018-06-07 LAB — CBC
HEMATOCRIT: 45.1 % (ref 40.0–52.0)
Hemoglobin: 15.6 g/dL (ref 13.0–18.0)
MCH: 31.5 pg (ref 26.0–34.0)
MCHC: 34.6 g/dL (ref 32.0–36.0)
MCV: 91.1 fL (ref 80.0–100.0)
PLATELETS: 231 10*3/uL (ref 150–440)
RBC: 4.95 MIL/uL (ref 4.40–5.90)
RDW: 13.7 % (ref 11.5–14.5)
WBC: 9.8 10*3/uL (ref 3.8–10.6)

## 2018-06-07 LAB — TROPONIN I
Troponin I: <0.03 ng/mL (ref <0.03)
Troponin I: <0.03 ng/mL (ref <0.03)

## 2018-06-07 MED ORDER — DIAZEPAM 2 MG PO TABS
2.0000 mg | ORAL_TABLET | Freq: Once | ORAL | Status: AC
  Administered 2018-06-07: 2 mg via ORAL
  Filled 2018-06-07: qty 1

## 2018-06-07 NOTE — ED Provider Notes (Signed)
[facility] Emergency Department Provider Note  ____________________________________________   None    (approximate)  I have reviewed the triage vital signs and the nursing notes.   HISTORY  Chief Complaint Chest Pain   HPI Jose Zimmerman is a 32 y.o. male who presents to the emergency department for treatment and evaluation of chest pressure that started last night while watching television. Pressure is midsternal and nonradiating. He denies palpitations or feeling of irregular rate. He denies feeling feeling faint. He has had some associated shortness of breath, nausea, 3 episodes of vomiting, and diaphoresis today. He smokes cigarettes but denies illicit drug use. He reports a sudden increase in life stressors and feels like crying and is anxious. He denies previous cardiac history or other chronic medical conditions.  Past Medical History:  Diagnosis Date  . Anxiety   . Depression   . GERD (gastroesophageal reflux disease)   . Vertigo     Patient Active Problem List   Diagnosis Date Noted  . Enteritis 09/15/2015  . Depression with anxiety 03/15/2015  . Chronic pain syndrome 03/15/2015  . GERD (gastroesophageal reflux disease) 03/15/2015  . Restless legs syndrome 03/15/2015    Past Surgical History:  Procedure Laterality Date  . ULNAR NERVE TRANSPOSITION Left 2009    Prior to Admission medications   Medication Sig Start Date End Date Taking? Authorizing Provider  Aspirin-Acetaminophen-Caffeine (GOODYS EXTRA STRENGTH) [phone] MG PACK Take 1 packet by mouth 2 (two) times daily as needed (headache).    Yes [provider]  naproxen (NAPROSYN) 500 MG tablet Take 1 tablet (500 mg total) by mouth 2 (two) times daily with a meal. Patient not taking: Reported on 06/07/2018 03/25/18   Lutricia Feil, PA-C  tiZANidine (ZANAFLEX) 4 MG capsule Take 1 capsule (4 mg total) by mouth 3 (three) times daily. Patient not taking: Reported on  06/07/2018 03/25/18   Lutricia Feil, PA-C    Allergies Gabapentin and Keppra [levetiracetam]  Family History  Problem Relation Age of Onset  . Osteoporosis Mother   . Diabetes Father   . Hypertension Father   . Hyperlipidemia Father   . Crohn's disease Other     Social History Social History   Tobacco Use  . Smoking status: Current Every Day Smoker    Packs/day: 1.00    Years: 10.00    Pack years: 10.00    Types: Cigarettes  . Smokeless tobacco: Current User  Substance Use Topics  . Alcohol use: Not Currently    Frequency: Never    Comment: quit June 2018  . Drug use: No    Review of Systems  Constitutional: No fever/chills Eyes: No visual changes. ENT: No sore throat. Cardiovascular: Denies chest pain. Respiratory: Denies shortness of breath. Gastrointestinal: No abdominal pain.  No nausea, no vomiting.  No diarrhea.  No constipation. Genitourinary: Negative for dysuria. Musculoskeletal: Negative for back pain. Skin: Negative for rash. Neurological: Negative for headaches, focal weakness or numbness. ____________________________________________   PHYSICAL EXAM:  VITAL SIGNS: ED Triage Vitals  Enc Vitals Group     BP 06/07/18 1431 112/69     Pulse Rate 06/07/18 1431 91     Resp --      Temp 06/07/18 1431 97.6 F (36.4 C)     Temp Source 06/07/18 1431 Oral     SpO2 06/07/18 1431 99 %     Weight 06/07/18 1431 150 lb (68 kg)     Height 06/07/18 1431 5\' 9"  (1.753 m)  Head Circumference --      Peak Flow --      Pain Score 06/07/18 1437 7     Pain Loc --      Pain Edu? --      Excl. in GC? --     Constitutional: Alert and oriented. Well appearing and in no acute distress. Eyes: Conjunctivae are normal.  Head: Atraumatic. Nose: No congestion/rhinnorhea. Mouth/Throat: Mucous membranes are moist.  Oropharynx non-erythematous. Neck: No stridor.   Cardiovascular: Normal rate, regular rhythm. Grossly normal heart sounds.  Good peripheral  circulation. Respiratory: Normal respiratory effort.  No retractions. Lungs CTAB. Gastrointestinal: Soft and nontender. No distention. No abdominal bruits. No CVA tenderness. Musculoskeletal: No lower extremity tenderness nor edema.  No joint effusions. Neurologic:  Normal speech and language. No gross focal neurologic deficits are appreciated. No gait instability. Skin:  Skin is warm, dry and intact. No rash noted. Psychiatric: Mood and affect are normal. Speech and behavior are normal.  ____________________________________________   LABS (all labs ordered are listed, but only abnormal results are displayed)  Labs Reviewed  CBC  TROPONIN I  COMPREHENSIVE METABOLIC PANEL  TROPONIN I   ____________________________________________  EKG  ED ECG REPORT I, Kem Boroughsari Britanie Harshman, the attending physician, personally viewed and interpreted this ECG.   Date: 06/07/2018  EKG Time: 2:38 PM  Rate: 86  Rhythm: normal EKG, normal sinus rhythm  Axis: Normal  Intervals:none  ST&T Change: No ST elevation.  No EKG changes compared to tracing from December 22, 2017.  ____________________________________________  RADIOLOGY  ED MD interpretation:  Chest x-ray negative for acute cardiopulmonary abnormality per radiology.  Official radiology report(s): Dg Chest 2 View  Result Date: 06/07/2018 CLINICAL DATA:  Chest pain, shortness of breath, nausea and vomiting. Smoker. EXAM: CHEST - 2 VIEW COMPARISON:  Chest radiograph March 25, 2018 FINDINGS: Cardiomediastinal silhouette is normal. No pleural effusions or focal consolidations. Similar bronchitic changes. Trachea projects midline and there is no pneumothorax. Soft tissue planes and included osseous structures are non-suspicious. IMPRESSION: Similar bronchitic changes without focal consolidation. Electronically Signed   By: Awilda Metroourtnay  Bloomer M.D.   On: 06/07/2018 15:27    ____________________________________________   PROCEDURES  Procedure(s)  performed: None  Procedures  Critical Care performed: No  ____________________________________________   INITIAL IMPRESSION / ASSESSMENT AND PLAN / ED COURSE  As part of my medical decision making, I reviewed the following data within the electronic MEDICAL RECORD NUMBER   32 year old male presenting to the emergency department for treatment and evaluation of chest pressure.  It appears that he has been evaluated for this several times within the last year with a negative work-up each time.  Today, his initial troponin is normal and there are no EKG changes in comparison to the tracing from April of this year.  Heart score is 0.  Plan will be to send a second troponin and then discharged home.  Symptoms are most likely related to anxiety/depression/increased stressors. He has been diagnosed with depression and anxiety in the past and is not currently undergoing treatment or therapy for either. This is been discussed with the patient who agrees to the treatment plan.  ----------------------------------------- 7:34 PM on 06/07/2018 -----------------------------------------  Patient's pressure has decreased and he states that he is feeling better. I discussed with him the importance of follow up with cardiology despite a negative work up today. I will also refer him to [facility] for further evaluation of depression and anxiety. Additionally, he was advised that he should return to the  ER if chest pressure/pain acutely worsens or for other symptoms of concern. ____________________________________________   FINAL CLINICAL IMPRESSION(S) / ED DIAGNOSES  Final diagnoses:  Atypical chest pain  Anxiety     ED Discharge Orders    None       Note:  This document was prepared using Dragon voice recognition software and may include unintentional dictation errors.    Chinita Pester, FNP 06/07/18 2027    Rockne Menghini, MD 06/07/18 2229

## 2018-06-07 NOTE — ED Triage Notes (Signed)
Chest pain since last night. SOB and diaphoresis.

## 2018-06-07 NOTE — Discharge Instructions (Addendum)
Please call and schedule an appointment with cardiology, primary care, and [facility].  If your chest pain/pressure gets worse, changes, or causes you concern and you are unable to see primary care or cardiology, return to the ER.

## 2018-08-07 ENCOUNTER — Encounter: Payer: Self-pay | Admitting: Gynecology

## 2018-08-07 ENCOUNTER — Ambulatory Visit
Admission: EM | Admit: 2018-08-07 | Discharge: 2018-08-07 | Disposition: A | Discharge status: Home / Self Care | Payer: Self-pay | Attending: Family Medicine | Admitting: Family Medicine

## 2018-08-07 ENCOUNTER — Ambulatory Visit (INDEPENDENT_AMBULATORY_CARE_PROVIDER_SITE_OTHER): Payer: Self-pay

## 2018-08-07 ENCOUNTER — Other Ambulatory Visit: Payer: Self-pay

## 2018-08-07 DIAGNOSIS — M79674 Pain in right toe(s): Secondary | ICD-10-CM

## 2018-08-07 DIAGNOSIS — L03031 Cellulitis of right toe: Secondary | ICD-10-CM

## 2018-08-07 MED ORDER — MELOXICAM 15 MG PO TABS: 15.0000 mg | ORAL_TABLET | Freq: Every day | ORAL | 0 refills | Status: DC | PRN

## 2018-08-07 MED ORDER — SULFAMETHOXAZOLE-TRIMETHOPRIM 800-160 MG PO TABS: 1.0000 | ORAL_TABLET | Freq: Two times a day (BID) | ORAL | 0 refills | Status: AC

## 2018-08-07 MED ORDER — MUPIROCIN 2 % EX OINT: TOPICAL_OINTMENT | CUTANEOUS | 0 refills | Status: DC

## 2018-08-07 NOTE — ED Provider Notes (Signed)
[facility] ____________________________________________  Time seen: Approximately 1:03 PM  I have reviewed the triage vital signs and the nursing notes.  HISTORY  Chief Complaint Toe Pain  HPI Jose Zimmerman is a 32 y.o. male presenting for evaluation of right great toe pain present for the last 1 week.  Patient reports that he wears steel toed boots at work and the boots do rub across the top of his toe.  However also reports he believes he injured his toe at work possibly dropping a tire on the end, but states that he definitely remembers feeling pain while at work.  Has continued to remain active and ambulatory.  States however in the last few days he has noticed some redness to the top of his toe prompting him to come in.  States the area is very sore and tender.  Denies any drainage or known break in skin.  Denies insect bite.  Denies pain radiation, loss of sensation or other complaints.  No accompanying fevers.  No chest pain or shortness of breath.  Reports otherwise doing well denies other complaints.  Has applied some topical ointment to it, no other alleviating measures attempted.  Denies other aggravating factors.  Tetanus immunization is up-to-date and within the last 10 years.    Past Medical History:  Diagnosis Date  . Anxiety   . Depression   . GERD (gastroesophageal reflux disease)   . Vertigo     Patient Active Problem List   Diagnosis Date Noted  . Enteritis 09/15/2015  . Depression with anxiety 03/15/2015  . Chronic pain syndrome 03/15/2015  . GERD (gastroesophageal reflux disease) 03/15/2015  . Restless legs syndrome 03/15/2015    Past Surgical History:  Procedure Laterality Date  . ULNAR NERVE TRANSPOSITION Left 2009     No current facility-administered medications for this encounter.   Current Outpatient Medications:  .  meloxicam (MOBIC) 15 MG tablet, Take 1 tablet (15 mg total) by mouth daily as needed., Disp: 7 tablet, Rfl: 0 .   mupirocin ointment (BACTROBAN) 2 %, Apply two times a day for 7 days., Disp: 22 g, Rfl: 0 .  sulfamethoxazole-trimethoprim (BACTRIM DS,SEPTRA DS) 800-160 MG tablet, Take 1 tablet by mouth 2 (two) times daily for 10 days., Disp: 20 tablet, Rfl: 0  Allergies Gabapentin and Keppra [levetiracetam]  Family History  Problem Relation Age of Onset  . Osteoporosis Mother   . Diabetes Father   . Hypertension Father   . Hyperlipidemia Father   . Crohn's disease Other     Social History Social History   Tobacco Use  . Smoking status: Current Every Day Smoker    Packs/day: 1.00    Years: 10.00    Pack years: 10.00    Types: Cigarettes  . Smokeless tobacco: Current User  Substance Use Topics  . Alcohol use: Not Currently    Frequency: Never    Comment: quit June 2018  . Drug use: No    Review of Systems Constitutional: No fever Cardiovascular: Denies chest pain. Respiratory: Denies shortness of breath. Musculoskeletal: positive toe pain.  Skin: as above.    ____________________________________________   PHYSICAL EXAM:  VITAL SIGNS: ED Triage Vitals  Enc Vitals Group     BP 08/07/18 1208 (!) 124/91     Pulse Rate 08/07/18 1208 92     Resp 08/07/18 1208 16     Temp 08/07/18 1208 98.8 F (37.1 C)     Temp Source 08/07/18 1208 Oral     SpO2  08/07/18 1208 100 %     Weight 08/07/18 1209 155 lb (70.3 kg)     Height 08/07/18 1209 5\' 9"  (1.753 m)     Head Circumference --      Peak Flow --      Pain Score 08/07/18 1209 8     Pain Loc --      Pain Edu? --      Excl. in GC? --     Constitutional: Alert and oriented. Well appearing and in no acute distress. ENT      Head: Normocephalic and atraumatic. Cardiovascular: Normal rate, regular rhythm. Grossly normal heart sounds.  Good peripheral circulation. Respiratory: Normal respiratory effort without tachypnea nor retractions. Breath sounds are clear and equal bilaterally. No wheezes, rales, rhonchi. Musculoskeletal: Steady  gait.  Posterior tibialis and dorsalis pedis right pulses equal.  Right foot normal distal sensation.  Right dorsal proximal great toe skin changes as depicted above with moderate proximal tenderness, no fluctuance, no pointing abscess, no drainage.  Right foot otherwise nontender.    Neurologic:  Normal speech and language. Speech is normal. No gait instability.  Skin:  Skin is warm, dry Psychiatric: Mood and affect are normal. Speech and behavior are normal. Patient exhibits appropriate insight and judgment   ___________________________________________   LABS (all labs ordered are listed, but only abnormal results are displayed)  Labs Reviewed - No data to display ____________________________________________  ___RADIOLOGY  Alisia Ferrari Great Right  Result Date: 08/07/2018 CLINICAL DATA:  Swelling and pain at RIGHT great toe at IP joint post injury EXAM: RIGHT GREAT TOE COMPARISON:  None FINDINGS: Osseous mineralization normal. Joint spaces preserved. No fracture, dislocation, or bone destruction. IMPRESSION: Normal exam. Electronically Signed   By: Ulyses Southward M.D.   On: 08/07/2018 13:27   ____________________________________________   PROCEDURES Procedures   INITIAL IMPRESSION / ASSESSMENT AND PLAN / ED COURSE  Pertinent labs & imaging results that were available during my care of the patient were reviewed by me and considered in my medical decision making (see chart for details).  Well-appearing patient.  No acute distress.  Right great toe pain.  Skin as above, no clear abscess, suspect inflammation as well as secondary infection.  Right great toe x-ray as above, per radiologist normal exam.  Encourage warm soapy water soaks, keeping clean.  Will treat with oral Bactrim and topical Bactroban.  Mobic as needed for pain. Avoidance of irritant factors including rubbing from shoes. Discussed indication, risks and benefits of medications with patient.  Discussed follow up with Primary  care physician this week as needed. Discussed follow up and return parameters including no resolution or any worsening concerns. Patient verbalized understanding and agreed to plan.   ____________________________________________   FINAL CLINICAL IMPRESSION(S) / ED DIAGNOSES  Final diagnoses:  Great toe pain, right  Cellulitis of great toe, right     ED Discharge Orders         Ordered    sulfamethoxazole-trimethoprim (BACTRIM DS,SEPTRA DS) 800-160 MG tablet  2 times daily     08/07/18 1334    mupirocin ointment (BACTROBAN) 2 %     08/07/18 1334    meloxicam (MOBIC) 15 MG tablet  Daily PRN     08/07/18 1334           Note: This dictation was prepared with Dragon dictation along with smaller phrase technology. Any transcriptional errors that result from this process are unintentional.         Renford Dills, NP  08/07/18 1403  

## 2018-08-07 NOTE — ED Triage Notes (Signed)
Patient c/o right big toe infection x 1 week. Per patient painful to walk on.

## 2018-08-07 NOTE — Discharge Instructions (Addendum)
Take medication as prescribed. Rest. Drink plenty of fluids.  ° °Follow up with your primary care physician this week as needed. Return to Urgent care for new or worsening concerns.  ° °

## 2018-11-15 ENCOUNTER — Other Ambulatory Visit: Payer: Self-pay

## 2018-11-15 ENCOUNTER — Encounter: Payer: Self-pay | Admitting: Emergency Medicine

## 2018-11-15 ENCOUNTER — Ambulatory Visit
Admission: EM | Admit: 2018-11-15 | Discharge: 2018-11-15 | Disposition: A | Discharge status: Home / Self Care | Payer: Self-pay | Attending: Family Medicine | Admitting: Family Medicine

## 2018-11-15 DIAGNOSIS — Z23 Encounter for immunization: Secondary | ICD-10-CM

## 2018-11-15 DIAGNOSIS — S61214A Laceration without foreign body of right ring finger without damage to nail, initial encounter: Secondary | ICD-10-CM

## 2018-11-15 DIAGNOSIS — W268XXA Contact with other sharp object(s), not elsewhere classified, initial encounter: Secondary | ICD-10-CM

## 2018-11-15 MED ORDER — TETANUS-DIPHTH-ACELL PERTUSSIS 5-2.5-18.5 LF-MCG/0.5 IM SUSP
0.5000 mL | Freq: Once | INTRAMUSCULAR | Status: AC
  Administered 2018-11-15: 0.5 mL via INTRAMUSCULAR

## 2018-11-15 MED ORDER — MUPIROCIN 2 % EX OINT: 1.0000 "application " | TOPICAL_OINTMENT | Freq: Two times a day (BID) | CUTANEOUS | 0 refills | Status: AC

## 2018-11-15 NOTE — Discharge Instructions (Signed)
Sutures out in 10 days.  Keep clean.  Antibiotic ointment twice daily.  Keep covered at work.  Take care  Dr. Adriana Simas

## 2018-11-15 NOTE — ED Provider Notes (Signed)
[facility]    CSN: [id number] Arrival date & time: 11/15/18  1520  History   Chief Complaint Chief Complaint  Patient presents with  . Laceration   HPI  33 year old male presents for laceration.  Patient suffered a laceration of his right fourth digit today.  Patient is on the dorsal aspect at the PIP joint.  Patient reports that he cut it on a piece of plastic while working on a car.  Reports that his last tetanus was a year ago.  There is no evidence of this in the chart.  Bleeding is well controlled at this time.  Wound has been cleaned.  Mild pain currently.  No other associated symptoms.  No other complaints.  Past Medical History:  Diagnosis Date  . Anxiety   . Depression   . GERD (gastroesophageal reflux disease)   . Vertigo    Patient Active Problem List   Diagnosis Date Noted  . Enteritis 09/15/2015  . Depression with anxiety 03/15/2015  . Chronic pain syndrome 03/15/2015  . GERD (gastroesophageal reflux disease) 03/15/2015  . Restless legs syndrome 03/15/2015   Past Surgical History:  Procedure Laterality Date  . ULNAR NERVE TRANSPOSITION Left 2009   Home Medications    Prior to Admission medications   Medication Sig Start Date End Date Taking? Authorizing Provider  mupirocin ointment (BACTROBAN) 2 % Apply 1 application topically 2 (two) times daily for 7 days. 11/15/18 11/22/18  Tommie Sams, DO  omeprazole (PRILOSEC) 20 MG capsule Take by mouth.    [provider]   Family History Family History  Problem Relation Age of Onset  . Osteoporosis Mother   . Diabetes Father   . Hypertension Father   . Hyperlipidemia Father   . Crohn's disease Other    Social History Social History   Tobacco Use  . Smoking status: Current Every Day Smoker    Packs/day: 0.50    Years: 10.00    Pack years: 5.00    Types: Cigarettes  . Smokeless tobacco: Former Engineer, water Use Topics  . Alcohol use: Yes    Frequency: Never    Comment: quit June  2018  . Drug use: No   Allergies   Gabapentin and Keppra [levetiracetam]   Review of Systems Review of Systems  Constitutional: Negative.   Skin: Positive for wound.   Physical Exam Triage Vital Signs ED Triage Vitals  Enc Vitals Group     BP 11/15/18 1608 (!) 140/100     Pulse Rate 11/15/18 1608 87     Resp 11/15/18 1608 16     Temp 11/15/18 1608 98.3 F (36.8 C)     Temp Source 11/15/18 1608 Oral     SpO2 11/15/18 1608 100 %     Weight 11/15/18 1605 153 lb (69.4 kg)     Height 11/15/18 1605  (1.778 m)     Head Circumference --      Peak Flow --      Pain Score 11/15/18 1605 3     Pain Loc --      Pain Edu? --      Excl. in GC? --    Updated Vital Signs BP (!) 140/100 (BP Location: Left Arm)   Pulse 87   Temp 98.3 F (36.8 C) (Oral)   Resp 16   Ht  (1.778 m)   Wt 69.4 kg   SpO2 100%   BMI 21.95 kg/m   Visual Acuity Right Eye Distance:  Left Eye Distance:   Bilateral Distance:    Right Eye Near:   Left Eye Near:    Bilateral Near:     Physical Exam Vitals signs and nursing note reviewed.  Constitutional:      General: He is not in acute distress.    Appearance: Normal appearance.  HENT:     Head: Normocephalic and atraumatic.  Eyes:     General: No scleral icterus.    Conjunctiva/sclera: Conjunctivae normal.  Pulmonary:     Effort: Pulmonary effort is normal. No respiratory distress.  Musculoskeletal: Normal range of motion.  Skin:    Comments: 1.2 cm curvilinear laceration aspect of the right fourth digit at the PIP joint.  Neurological:     Mental Status: He is alert.  Psychiatric:        Mood and Affect: Mood normal.        Behavior: Behavior normal.    UC Treatments / Results  Labs (all labs ordered are listed, but only abnormal results are displayed) Labs Reviewed - No data to display  EKG None  Radiology No results found.  Procedures Laceration Repair Date/Time: 11/15/2018 5:36 PM Performed by: Tommie Sams,  DO Authorized by: Tommie Sams, DO   Consent:    Consent obtained:  Verbal   Consent given by:  Patient Anesthesia (see MAR for exact dosages):    Anesthesia method:  Local infiltration   Local anesthetic:  Lidocaine 1% w/o epi Laceration details:    Location:  Finger   Finger location:  R ring finger   Length (cm):  1.2 Repair type:    Repair type:  Simple Pre-procedure details:    Preparation:  Patient was prepped and draped in usual sterile fashion Treatment:    Area cleansed with:  Betadine and soap and water   Amount of cleaning:  Standard   Irrigation solution:  Sterile saline   Visualized foreign bodies/material removed: yes   Skin repair:    Repair method:  Sutures   Suture size:  5-0   Suture material:  Prolene   Suture technique:  Simple interrupted   Number of sutures:  3 Approximation:    Approximation:  Close Post-procedure details:    Dressing:  Antibiotic ointment and bulky dressing   Patient tolerance of procedure:  Tolerated well, no immediate complications   (including critical care time)  Medications Ordered in UC Medications  Tdap (BOOSTRIX) injection 0.5 mL (0.5 mLs Intramuscular Given 11/15/18 1719)    Initial Impression / Assessment and Plan / UC Course  I have reviewed the triage vital signs and the nursing notes.  Pertinent labs & imaging results that were available during my care of the patient were reviewed by me and considered in my medical decision making (see chart for details).    33 year old male presents with a laceration.  Repaired as above.  Bactroban ointment twice daily.  Sutures out in 10 days.  Tetanus given today.  Final Clinical Impressions(s) / UC Diagnoses   Final diagnoses:  Laceration of right ring finger without foreign body without damage to nail, initial encounter     Discharge Instructions     Sutures out in 10 days.  Keep clean.  Antibiotic ointment twice daily.  Keep covered at work.  Take care  Dr.  Adriana Simas    ED Prescriptions    Medication Sig Dispense Auth. Provider   mupirocin ointment (BACTROBAN) 2 % Apply 1 application topically 2 (two) times daily for 7 days. 22  g Tommie Sams, DO     Controlled Substance Prescriptions [place] Controlled Substance Registry consulted? Not Applicable   Tommie Sams, DO 11/15/18 1737

## 2018-11-15 NOTE — ED Triage Notes (Signed)
Pt has a laceration on his right ring finger. Incident occurred about 2 hours ago. No active bleeding. He states that he saw his bone. Pt cut it on a car part. Pt states that he has his Tetanus vaccine last year but do not see record of it in epic.

## 2019-01-03 ENCOUNTER — Encounter: Payer: Self-pay | Admitting: Emergency Medicine

## 2019-01-03 ENCOUNTER — Emergency Department: Payer: Medicaid Other

## 2019-01-03 ENCOUNTER — Emergency Department
Admission: EM | Admit: 2019-01-03 | Discharge: 2019-01-03 | Disposition: A | Discharge status: Home / Self Care | Payer: Medicaid Other | Attending: Emergency Medicine | Admitting: Emergency Medicine

## 2019-01-03 ENCOUNTER — Other Ambulatory Visit: Payer: Self-pay

## 2019-01-03 DIAGNOSIS — S0990XA Unspecified injury of head, initial encounter: Secondary | ICD-10-CM | POA: Diagnosis not present

## 2019-01-03 DIAGNOSIS — Y998 Other external cause status: Secondary | ICD-10-CM | POA: Diagnosis not present

## 2019-01-03 DIAGNOSIS — Y93I9 Activity, other involving external motion: Secondary | ICD-10-CM | POA: Insufficient documentation

## 2019-01-03 DIAGNOSIS — Z79899 Other long term (current) drug therapy: Secondary | ICD-10-CM | POA: Insufficient documentation

## 2019-01-03 DIAGNOSIS — S199XXA Unspecified injury of neck, initial encounter: Secondary | ICD-10-CM | POA: Diagnosis not present

## 2019-01-03 DIAGNOSIS — K219 Gastro-esophageal reflux disease without esophagitis: Secondary | ICD-10-CM | POA: Diagnosis not present

## 2019-01-03 DIAGNOSIS — Y92410 Unspecified street and highway as the place of occurrence of the external cause: Secondary | ICD-10-CM | POA: Diagnosis not present

## 2019-01-03 DIAGNOSIS — R42 Dizziness and giddiness: Secondary | ICD-10-CM | POA: Diagnosis not present

## 2019-01-03 MED ORDER — MELOXICAM 15 MG PO TABS: 15.0000 mg | ORAL_TABLET | Freq: Every day | ORAL | 1 refills | Status: AC

## 2019-01-03 MED ORDER — METHOCARBAMOL 500 MG PO TABS: 500.0000 mg | ORAL_TABLET | Freq: Three times a day (TID) | ORAL | 0 refills | Status: AC | PRN

## 2019-01-03 MED ORDER — ONDANSETRON HCL 4 MG PO TABS: 4.0000 mg | ORAL_TABLET | Freq: Three times a day (TID) | ORAL | 0 refills | Status: AC | PRN
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
## 2019-01-07 ENCOUNTER — Emergency Department
Admission: EM | Admit: 2019-01-07 | Discharge: 2019-01-07 | Disposition: A | Discharge status: Home / Self Care | Payer: Medicaid Other | Attending: Emergency Medicine | Admitting: Emergency Medicine

## 2019-01-07 ENCOUNTER — Encounter: Payer: Self-pay | Admitting: Emergency Medicine

## 2019-01-07 ENCOUNTER — Other Ambulatory Visit: Payer: Self-pay

## 2019-01-07 ENCOUNTER — Emergency Department: Payer: Medicaid Other

## 2019-01-07 DIAGNOSIS — F17210 Nicotine dependence, cigarettes, uncomplicated: Secondary | ICD-10-CM | POA: Insufficient documentation

## 2019-01-07 DIAGNOSIS — R079 Chest pain, unspecified: Secondary | ICD-10-CM

## 2019-01-07 DIAGNOSIS — Z79899 Other long term (current) drug therapy: Secondary | ICD-10-CM | POA: Diagnosis not present

## 2019-01-07 DIAGNOSIS — R0789 Other chest pain: Secondary | ICD-10-CM | POA: Diagnosis not present

## 2019-01-07 LAB — CBC WITH DIFFERENTIAL/PLATELET
Abs Immature Granulocytes: 0.08 10*3/uL — ABNORMAL HIGH (ref 0.00–0.07)
Basophils Absolute: 0 10*3/uL (ref 0.0–0.1)
Basophils Relative: 0 %
Eosinophils Absolute: 0.3 10*3/uL (ref 0.0–0.5)
Eosinophils Relative: 2 %
HCT: 46.1 % (ref 39.0–52.0)
Hemoglobin: 15.4 g/dL (ref 13.0–17.0)
Immature Granulocytes: 1 %
Lymphocytes Relative: 13 %
Lymphs Abs: 2.3 10*3/uL (ref 0.7–4.0)
MCH: 30.7 pg (ref 26.0–34.0)
MCHC: 33.4 g/dL (ref 30.0–36.0)
MCV: 92 fL (ref 80.0–100.0)
Monocytes Absolute: 1 10*3/uL (ref 0.1–1.0)
Monocytes Relative: 6 %
Neutro Abs: 14 10*3/uL — ABNORMAL HIGH (ref 1.7–7.7)
Neutrophils Relative %: 78 %
Platelets: 253 10*3/uL (ref 150–400)
RBC: 5.01 MIL/uL (ref 4.22–5.81)
RDW: 12.4 % (ref 11.5–15.5)
WBC: 17.7 10*3/uL — ABNORMAL HIGH (ref 4.0–10.5)
nRBC: 0 % (ref 0.0–0.2)

## 2019-01-07 LAB — COMPREHENSIVE METABOLIC PANEL
ALT: 18 U/L (ref 0–44)
AST: 18 U/L (ref 15–41)
Albumin: 4.7 g/dL (ref 3.5–5.0)
Alkaline Phosphatase: 89 U/L (ref 38–126)
Anion gap: 11 (ref 5–15)
BUN: 14 mg/dL (ref 6–20)
CO2: 27 mmol/L (ref 22–32)
Calcium: 9.9 mg/dL (ref 8.9–10.3)
Chloride: 100 mmol/L (ref 98–111)
Creatinine, Ser: 0.87 mg/dL (ref 0.61–1.24)
GFR calc Af Amer: >60 mL/min (ref >60)
GFR calc non Af Amer: >60 mL/min (ref >60)
Glucose, Bld: 118 mg/dL — ABNORMAL HIGH (ref 70–99)
Potassium: 4.2 mmol/L (ref 3.5–5.1)
Sodium: 138 mmol/L (ref 135–145)
Total Bilirubin: 0.7 mg/dL (ref 0.3–1.2)
Total Protein: 7.8 g/dL (ref 6.5–8.1)

## 2019-01-07 LAB — TROPONIN I: Troponin I: <0.03 ng/mL (ref <0.03)

## 2019-01-07 LAB — LIPASE, BLOOD: Lipase: 19 U/L (ref 11–51)

## 2019-01-07 MED ORDER — KETOROLAC TROMETHAMINE 30 MG/ML IJ SOLN
15.0000 mg | Freq: Once | INTRAMUSCULAR | Status: AC
  Administered 2019-01-07: 03:00:00 15 mg via INTRAVENOUS
  Filled 2019-01-07: qty 1

## 2019-01-07 MED ORDER — ONDANSETRON HCL 4 MG/2ML IJ SOLN
4.0000 mg | Freq: Once | INTRAMUSCULAR | Status: AC
  Administered 2019-01-07: 03:00:00 4 mg via INTRAVENOUS
  Filled 2019-01-07: qty 2

## 2019-01-07 MED ORDER — FAMOTIDINE IN NACL 20-0.9 MG/50ML-% IV SOLN
20.0000 mg | Freq: Once | INTRAVENOUS | Status: AC
  Administered 2019-01-07: 05:00:00 20 mg via INTRAVENOUS
  Filled 2019-01-07: qty 50

## 2019-01-07 MED ORDER — OXYCODONE-ACETAMINOPHEN 5-325 MG PO TABS: 1.0000 | ORAL_TABLET | ORAL | 0 refills | Status: DC | PRN

## 2019-01-07 MED ORDER — OXYCODONE-ACETAMINOPHEN 5-325 MG PO TABS
1.0000 | ORAL_TABLET | Freq: Once | ORAL | Status: AC
  Administered 2019-01-07: 1 via ORAL
  Filled 2019-01-07: qty 1
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
## 2019-01-07 NOTE — ED Notes (Signed)
Reviewed discharge instructions, follow-up care, and prescriptions with patient. Patient verbalized understanding of all information reviewed. Patient stable, with no distress noted at this time.    

## 2019-01-07 NOTE — ED Triage Notes (Signed)
Pt in with co chest pain that started yesterday, no hx of cardiac disease. Also co nausea, denies any recent illness.

## 2019-01-07 NOTE — Discharge Instructions (Addendum)
You may continue to take the other medicines which are prescribed to you.  Take Percocet as needed for more severe pain.  Apply moist heat to affected area several times daily.  Return to the ER for worsening symptoms, persistent vomiting, difficulty breathing or other concerns.

## 2019-01-07 NOTE — ED Notes (Signed)
ED Provider at bedside. 

## 2019-08-30 ENCOUNTER — Other Ambulatory Visit: Payer: Self-pay

## 2019-08-30 ENCOUNTER — Ambulatory Visit (HOSPITAL_COMMUNITY): Payer: Medicaid Other | Admitting: Licensed Clinical Social Worker

## 2019-08-30 ENCOUNTER — Telehealth (HOSPITAL_COMMUNITY): Payer: Self-pay | Admitting: Licensed Clinical Social Worker

## 2019-08-30 NOTE — Telephone Encounter (Signed)
Sent text at 11:00am and 11:10am . The patient did not respond for video session.

## 2019-09-17 ENCOUNTER — Emergency Department
Admission: EM | Admit: 2019-09-17 | Discharge: 2019-09-18 | Disposition: A | Discharge status: Other Acute Inpatient Hospital | Payer: Medicaid Other | Attending: Emergency Medicine | Admitting: Emergency Medicine

## 2019-09-17 ENCOUNTER — Other Ambulatory Visit: Payer: Self-pay

## 2019-09-17 ENCOUNTER — Emergency Department: Payer: Medicaid Other

## 2019-09-17 DIAGNOSIS — F17210 Nicotine dependence, cigarettes, uncomplicated: Secondary | ICD-10-CM | POA: Diagnosis not present

## 2019-09-17 DIAGNOSIS — Z79899 Other long term (current) drug therapy: Secondary | ICD-10-CM | POA: Diagnosis not present

## 2019-09-17 DIAGNOSIS — R404 Transient alteration of awareness: Secondary | ICD-10-CM

## 2019-09-17 DIAGNOSIS — T50901A Poisoning by unspecified drugs, medicaments and biological substances, accidental (unintentional), initial encounter: Secondary | ICD-10-CM | POA: Diagnosis present

## 2019-09-17 DIAGNOSIS — T43012A Poisoning by tricyclic antidepressants, intentional self-harm, initial encounter: Secondary | ICD-10-CM | POA: Diagnosis not present

## 2019-09-17 DIAGNOSIS — F329 Major depressive disorder, single episode, unspecified: Secondary | ICD-10-CM | POA: Diagnosis not present

## 2019-09-17 DIAGNOSIS — Z20822 Contact with and (suspected) exposure to covid-19: Secondary | ICD-10-CM | POA: Diagnosis not present

## 2019-09-17 DIAGNOSIS — R4182 Altered mental status, unspecified: Secondary | ICD-10-CM | POA: Diagnosis present

## 2019-09-17 DIAGNOSIS — F322 Major depressive disorder, single episode, severe without psychotic features: Secondary | ICD-10-CM | POA: Diagnosis present

## 2019-09-17 DIAGNOSIS — T43011A Poisoning by tricyclic antidepressants, accidental (unintentional), initial encounter: Secondary | ICD-10-CM | POA: Insufficient documentation

## 2019-09-17 LAB — CBC WITH DIFFERENTIAL/PLATELET
Abs Immature Granulocytes: 0.09 10*3/uL — ABNORMAL HIGH (ref 0.00–0.07)
Basophils Absolute: 0.1 10*3/uL (ref 0.0–0.1)
Basophils Relative: 0 %
Eosinophils Absolute: 0.1 10*3/uL (ref 0.0–0.5)
Eosinophils Relative: 0 %
HCT: 47.6 % (ref 39.0–52.0)
Hemoglobin: 16.6 g/dL (ref 13.0–17.0)
Immature Granulocytes: 1 %
Lymphocytes Relative: 11 %
Lymphs Abs: 2.2 10*3/uL (ref 0.7–4.0)
MCH: 30.4 pg (ref 26.0–34.0)
MCHC: 34.9 g/dL (ref 30.0–36.0)
MCV: 87.2 fL (ref 80.0–100.0)
Monocytes Absolute: 1 10*3/uL (ref 0.1–1.0)
Monocytes Relative: 5 %
Neutro Abs: 16.1 10*3/uL — ABNORMAL HIGH (ref 1.7–7.7)
Neutrophils Relative %: 83 %
Platelets: 219 10*3/uL (ref 150–400)
RBC: 5.46 MIL/uL (ref 4.22–5.81)
RDW: 12.6 % (ref 11.5–15.5)
WBC: 19.5 10*3/uL — ABNORMAL HIGH (ref 4.0–10.5)
nRBC: 0 % (ref 0.0–0.2)

## 2019-09-17 LAB — COMPREHENSIVE METABOLIC PANEL
ALT: 22 U/L (ref 0–44)
AST: 24 U/L (ref 15–41)
Albumin: 4.3 g/dL (ref 3.5–5.0)
Alkaline Phosphatase: 74 U/L (ref 38–126)
Anion gap: 11 (ref 5–15)
BUN: 15 mg/dL (ref 6–20)
CO2: 23 mmol/L (ref 22–32)
Calcium: 9.3 mg/dL (ref 8.9–10.3)
Chloride: 107 mmol/L (ref 98–111)
Creatinine, Ser: 0.92 mg/dL (ref 0.61–1.24)
GFR calc Af Amer: >60 mL/min (ref >60)
GFR calc non Af Amer: >60 mL/min (ref >60)
Glucose, Bld: 127 mg/dL — ABNORMAL HIGH (ref 70–99)
Potassium: 4.2 mmol/L (ref 3.5–5.1)
Sodium: 141 mmol/L (ref 135–145)
Total Bilirubin: 0.9 mg/dL (ref 0.3–1.2)
Total Protein: 7.3 g/dL (ref 6.5–8.1)

## 2019-09-17 LAB — URINE DRUG SCREEN, QUALITATIVE (ARMC ONLY)
Amphetamines, Ur Screen: NOT DETECTED
Barbiturates, Ur Screen: NOT DETECTED
Benzodiazepine, Ur Scrn: NOT DETECTED
Cannabinoid 50 Ng, Ur ~~LOC~~: NOT DETECTED
Cocaine Metabolite,Ur ~~LOC~~: NOT DETECTED
MDMA (Ecstasy)Ur Screen: NOT DETECTED
Methadone Scn, Ur: NOT DETECTED
Opiate, Ur Screen: NOT DETECTED
Phencyclidine (PCP) Ur S: NOT DETECTED
Tricyclic, Ur Screen: POSITIVE — AB

## 2019-09-17 LAB — URINALYSIS, ROUTINE W REFLEX MICROSCOPIC
Bilirubin Urine: NEGATIVE
Glucose, UA: NEGATIVE mg/dL
Hgb urine dipstick: NEGATIVE
Ketones, ur: NEGATIVE mg/dL
Leukocytes,Ua: NEGATIVE
Nitrite: NEGATIVE
Protein, ur: 30 mg/dL — AB
Specific Gravity, Urine: 1.029 (ref 1.005–1.030)
pH: 5 (ref 5.0–8.0)

## 2019-09-17 LAB — ACETAMINOPHEN LEVEL: Acetaminophen (Tylenol), Serum: <10 ug/mL — ABNORMAL LOW (ref 10–30)

## 2019-09-17 LAB — RESPIRATORY PANEL BY RT PCR (FLU A&B, COVID)
Influenza A by PCR: NEGATIVE
Influenza B by PCR: NEGATIVE
SARS Coronavirus 2 by RT PCR: NEGATIVE

## 2019-09-17 LAB — ETHANOL: Alcohol, Ethyl (B): <10 mg/dL (ref <10)

## 2019-09-17 LAB — PROTIME-INR
INR: 0.9 (ref 0.8–1.2)
Prothrombin Time: 12.5 seconds (ref 11.4–15.2)

## 2019-09-17 LAB — SALICYLATE LEVEL: Salicylate Lvl: <7 mg/dL — ABNORMAL LOW (ref 7.0–30.0)

## 2019-09-17 MED ORDER — SODIUM CHLORIDE 0.9 % IV BOLUS
1000.0000 mL | Freq: Once | INTRAVENOUS | Status: AC
  Administered 2019-09-17: 04:00:00 1000 mL via INTRAVENOUS

## 2019-09-17 MED ORDER — DROPERIDOL 2.5 MG/ML IJ SOLN
2.5000 mg | Freq: Once | INTRAMUSCULAR | Status: AC
  Administered 2019-09-17: 2.5 mg via INTRAVENOUS

## 2019-09-17 NOTE — ED Notes (Signed)
Report to include situation, background, assessment and recommendations from RN. Patient sleeping, respirations regular and unlabored. Q15 minute rounds and Officer observation to continue.
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
## 2019-09-17 NOTE — ED Notes (Signed)
Yellow socks placed on pt.

## 2019-09-17 NOTE — ED Notes (Signed)
Hourly rounding reveals patient sleeping in room. No complaints, stable, in no acute distress. Q15 minute rounds and monitoring via Rover and Officer to continue.  

## 2019-09-17 NOTE — ED Notes (Signed)
Mother Jose Zimmerman: [phone]

## 2019-09-17 NOTE — ED Notes (Signed)
1 piar of black socks, 1 pair of white socks, 1 green hoodie, 1 pain of jeans with a brown belt and [employer] buckle, 1 blue t shirt, 2 pairs of blue underwear, 1 pair of black earrings placed in pt belongings bag

## 2019-09-17 NOTE — ED Notes (Signed)
Report given to Amy, RN.

## 2019-09-17 NOTE — ED Notes (Signed)
Condom cath placed on pt at this time 

## 2019-09-17 NOTE — ED Notes (Signed)
Per poison control:  Bicarb bolus if QRS 100-120  Give Benzos 6hrs watch time/and back to baseline.

## 2019-09-17 NOTE — ED Notes (Signed)
Gave food tray with juice. 
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
## 2019-09-17 NOTE — ED Notes (Signed)
Spoke with Beth at poison control- no new recommendations at this time

## 2019-09-17 NOTE — ED Provider Notes (Addendum)
[facility] Emergency Department Provider Note  ____________________________________________   First MD Initiated Contact with Patient 09/17/19 [phone]     (approximate)  I have reviewed the triage vital signs and the nursing notes.   HISTORY  Chief Complaint Drug Overdose  Level 5 caveat:  history/ROS limited by acute/critical illness  HPI Jose Zimmerman is a 34 y.o. male who presents by EMS for decreased level of responsiveness and suspected intentional overdose.  According to EMS he was found by his family, slumped over and minimally responsive with vomit all around him.  They said that he has no prior history of intentional overdose but he has been going through a bad time recently with an ongoing divorce and that he does have a history of anxiety and depression.  EMS states that when they arrived he was minimally responsive but did respond to pain but was very combative, thrashing out of them in a nonpurposeful way and would not follow instructions.  They counted the medications and his containers and found that he had 17 tablets of buspirone 10 mg missing from his pill bottle and 7 missing tablets of amitriptyline 50 mg based on the dates the prescriptions were filled and how many he should have taken.  No other medications or bottles were around.  The patient  received Benadryl 50 mg IV in route to the hospital because of the patient's thrashing and concerns on the part of the paramedics that he was having extraparametal symptoms.  Upon arrival in the ED the patient will not respond to any commands but is combative to painful stimuli and attempts to redirect him.        Past Medical History:  Diagnosis Date  . Anxiety   . Depression   . GERD (gastroesophageal reflux disease)   . Vertigo     Patient Active Problem List   Diagnosis Date Noted  . Enteritis 09/15/2015  . Depression with anxiety 03/15/2015  . Chronic pain syndrome 03/15/2015  .  GERD (gastroesophageal reflux disease) 03/15/2015  . Restless legs syndrome 03/15/2015    Past Surgical History:  Procedure Laterality Date  . ULNAR NERVE TRANSPOSITION Left 2009    Prior to Admission medications   Medication Sig Start Date End Date Taking? Authorizing Provider  omeprazole (PRILOSEC) 20 MG capsule Take 20 mg by mouth daily.     [provider]  oxyCODONE-acetaminophen (PERCOCET/ROXICET) 5-325 MG tablet Take 1 tablet by mouth every 4 (four) hours as needed for severe pain. 01/07/19   Irean Hong, MD    Allergies Gabapentin and Keppra [levetiracetam]  Family History  Problem Relation Age of Onset  . Osteoporosis Mother   . Diabetes Father   . Hypertension Father   . Hyperlipidemia Father   . Crohn's disease Other     Social History Social History   Tobacco Use  . Smoking status: Current Every Day Smoker    Packs/day: 0.50    Years: 10.00    Pack years: 5.00    Types: Cigarettes  . Smokeless tobacco: Former Engineer, water Use Topics  . Alcohol use: Yes    Comment: quit June 2018  . Drug use: No    Review of Systems Level 5 caveat:  history/ROS limited by acute/critical illness ____________________________________________   PHYSICAL EXAM:  ED Triage Vitals  Enc Vitals Group     BP 09/17/19 0401 (!) 126/101     Pulse Rate 09/17/19 0401 (!) 121     Resp 09/17/19  0401 (!) 24     Temp 09/17/19 0412 (!) 97.4 F (36.3 C)     Temp Source 09/17/19 0412 Axillary     SpO2 09/17/19 0401 100 %     Weight 09/17/19 0356 79.4 kg (175 lb)     Height 09/17/19 0356 1.753 m (5\' 9" )     Head Circumference --      Peak Flow --      Pain Score 09/17/19 0355 Asleep     Pain Loc --      Pain Edu? --      Excl. in [place]? --      Constitutional: The patient is somewhat disheveled and is acutely altered, not following commands, protecting his airway but thrashing out intermittently and seemingly almost involuntarily but also quite back to painful  stimuli. Eyes: Conjunctivae are normal.  Pupils are about 5 mm, equal, but minimally reactive to light. Head: Atraumatic. Nose: No congestion/rhinnorhea. Mouth/Throat: A mask was applied to the patient.  No obvious intraoral abnormalities were identified prior to mask placement Neck: No stridor.  No meningeal signs.   Cardiovascular: Sinus tachycardia, regular rhythm. Good peripheral circulation. Grossly normal heart sounds. Respiratory: Normal respiratory effort.  No retractions. Gastrointestinal: Thin body habitus.  Soft and nontender. No distention.  Musculoskeletal: No lower extremity tenderness nor edema. No gross deformities of extremities. Neurologic: Patient is protecting his airway but cannot or will not follow commands.  Intermittently somnolent and sonorous but alternating with episodes where he will thrash out with his arms or his legs and suddenly sit up and open his eyes, then fall back onto the bed and began sonorous respirations again.  He localizes to painful stimuli. Skin:  Skin is warm, dry and intact.   ____________________________________________   LABS (all labs ordered are listed, but only abnormal results are displayed)  Labs Reviewed  CBC WITH DIFFERENTIAL/PLATELET - Abnormal; Notable for the following components:      Result Value   WBC 19.5 (*)    Neutro Abs 16.1 (*)    Abs Immature Granulocytes 0.09 (*)    All other components within normal limits  COMPREHENSIVE METABOLIC PANEL - Abnormal; Notable for the following components:   Glucose, Bld 127 (*)    All other components within normal limits  URINALYSIS, ROUTINE W REFLEX MICROSCOPIC - Abnormal; Notable for the following components:   Color, Urine AMBER (*)    APPearance HAZY (*)    Protein, ur 30 (*)    Bacteria, UA RARE (*)    All other components within normal limits  URINE DRUG SCREEN, QUALITATIVE ([facility] ONLY) - Abnormal; Notable for the following components:   Tricyclic, Ur Screen POSITIVE (*)     All other components within normal limits  ACETAMINOPHEN LEVEL - Abnormal; Notable for the following components:   Acetaminophen (Tylenol), Serum <10 (*)    All other components within normal limits  SALICYLATE LEVEL - Abnormal; Notable for the following components:   Salicylate Lvl <1.0 (*)    All other components within normal limits  RESPIRATORY PANEL BY RT PCR (FLU A&B, COVID)  PROTIME-INR  ETHANOL   ____________________________________________  EKG  ED ECG REPORT I, Hinda Kehr, the attending physician, personally viewed and interpreted this ECG.  Date: 09/17/2019 EKG Time: 3:56 AM Rate: 131 Rhythm: Sinus tachycardia QRS Axis: normal Intervals: QRS complex is 87 ms, QTc interval is 467 ms ST/T Wave abnormalities: normal Narrative Interpretation: no evidence of acute ischemia, QT prolongation, nor QRS widening.  ____________________________________________  RADIOLOGY I, Loleta Rose, personally viewed and evaluated these images (plain radiographs) as part of my medical decision making, as well as reviewing the written report by the radiologist.  ED MD interpretation: No indication of acute abnormality on head CT.  Official radiology report(s): CT Head Wo Contrast  Result Date: 09/17/2019 CLINICAL DATA:  Altered mental status. EXAM: CT HEAD WITHOUT CONTRAST TECHNIQUE: Contiguous axial images were obtained from the base of the skull through the vertex without intravenous contrast. COMPARISON:  Head CT 01/03/2019 FINDINGS: Brain: No intracranial hemorrhage, mass effect, or midline shift. No hydrocephalus. The basilar cisterns are patent. No evidence of territorial infarct or acute ischemia. No extra-axial or intracranial fluid collection. Vascular: No hyperdense vessel or unexpected calcification. Skull: No fracture or focal lesion. Sinuses/Orbits: Paranasal sinuses and mastoid air cells are clear. Dysconjugate gaze, typically incidental. Other: None. IMPRESSION: Unremarkable  noncontrast head CT. Electronically Signed   By: Narda Rutherford M.D.   On: 09/17/2019 05:23    ____________________________________________   PROCEDURES   Procedure(s) performed (including Critical Care):  .Critical Care Performed by: Loleta Rose, MD Authorized by: Loleta Rose, MD   Critical care provider statement:    Critical care time (minutes):  30   Critical care time was exclusive of:  Separately billable procedures and treating other patients   Critical care was necessary to treat or prevent imminent or life-threatening deterioration of the following conditions:  Toxidrome   Critical care was time spent personally by me on the following activities:  Development of treatment plan with patient or surrogate, discussions with consultants, evaluation of patient's response to treatment, examination of patient, obtaining history from patient or surrogate, ordering and performing treatments and interventions, ordering and review of laboratory studies, ordering and review of radiographic studies, pulse oximetry, re-evaluation of patient's condition and review of old charts     ____________________________________________   INITIAL IMPRESSION / MDM / ASSESSMENT AND PLAN / ED COURSE  As part of my medical decision making, I reviewed the following data within the electronic MEDICAL RECORD NUMBER Nursing notes reviewed and incorporated, Labs reviewed , EKG interpreted , Old chart reviewed, A consult was requested and obtained from this/these consultant(s) Psychiatry, Notes from prior ED visits and [place] Controlled Substance Database   Differential diagnosis includes, but is not limited to, intentional overdose as a suicide attempt, unintentional overdose, polypharmacy, narcotic or alcohol intoxication, acute intracranial bleeding, electrolyte or metabolic abnormality.  Under the circumstances including the family's apparent concern over the possibility of overdose given his ongoing divorce  and the missing medications from his bottles, I placed him under involuntary commitment.  He currently lacks capacity to make any decisions.  Additionally he is combative and nonredirectable.  After verifying that his QTc interval was appropriate I authorize droperidol 2.5 mg IV to help as a calming agent.  If he needs additional medications benzos may be appropriate given his tachycardia.  He is tachycardic and agitated but has normal oxygen saturation and he is normotensive.  I have ordered a rapid COVID-19 swab in case he needs an urgent intervention such as intubation and ICU admission.  I anticipate a period of medical clearance in the emergency department followed by psychiatry evaluation to determine the disposition.  Poison control was contacted and he is to be given bicarb boluses if his QRS widens, they recommended benzodiazepines, and recommended about 6 hours of watching and making sure that he returns to baseline.  The rest of his labs are pending including standard psych labs  for other concurrent overdoses.  I am also giving 1 L normal saline IV bolus.      Clinical Course as of Sep 16 706  Sat Sep 17, 2019  0532 Normal head CT  CT Head Wo Contrast [CF]  0532 SARS Coronavirus 2 by RT PCR: NEGATIVE [CF]  0532 Salicylate Lvl(!): <7.0 [CF]  0532 Alcohol, Ethyl (B): <10 [CF]  0532 Acetaminophen (Tylenol), S(!): <10 [CF]  0557 Tricyclic, Ur Screen(!): POSITIVE [CF]  0557 Unremarkable urinalysis, urine drug screen positive only for tricyclics.  Urinalysis, Routine w reflex microscopic(!) [CF]  0557 Normal comprehensive metabolic panel  Comprehensive metabolic panel(!) [CF]  0558 Leukocytosis of 19.5, likely secondary to agitation given no fever and no history of infectious process from the family.  CBC with Differential(!) [CF]  [id number] Patient still somnolent but no longer having any twitching episodes.  No acute distress.  Transferring ED care to Dr. Mayford Knife to reassess and follow-up on  psychiatry recommendations.   [CF]    Clinical Course User Index [CF] Loleta Rose, MD     ____________________________________________  FINAL CLINICAL IMPRESSION(S) / ED DIAGNOSES  Final diagnoses:  Overdose of tricyclic antidepressants, intentional self-harm, initial encounter (HCC)  Transient alteration of awareness     MEDICATIONS GIVEN DURING THIS VISIT:  Medications  sodium chloride 0.9 % bolus 1,000 mL (0 mLs Intravenous Stopped 09/17/19 0512)  droperidol (INAPSINE) 2.5 MG/ML injection 2.5 mg (2.5 mg Intravenous Given 09/17/19 0407)     ED Discharge Orders    None      *Please note:  PENG THORSTENSON was evaluated in Emergency Department on 09/17/2019 for the symptoms described in the history of present illness. He was evaluated in the context of the global COVID-19 pandemic, which necessitated consideration that the patient might be at risk for infection with the SARS-CoV-2 virus that causes COVID-19. Institutional protocols and algorithms that pertain to the evaluation of patients at risk for COVID-19 are in a state of rapid change based on information released by regulatory bodies including the CDC and federal and state organizations. These policies and algorithms were followed during the patient's care in the ED.  Some ED evaluations and interventions may be delayed as a result of limited staffing during the pandemic.*  Note:  This document was prepared using Dragon voice recognition software and may include unintentional dictation errors.   Loleta Rose, MD 09/17/19 [id number]    Loleta Rose, MD 09/17/19 [phone]

## 2019-09-17 NOTE — ED Notes (Addendum)
Pt has 2 bottle of medication placed with belongings- one bottle of amitriptyline and one of buspirone

## 2019-09-17 NOTE — ED Notes (Signed)
Per pt's mother there was a message on his cellphone that may indicate that he was trying to commit suicide

## 2019-09-17 NOTE — Consult Note (Signed)
[facility] Consult   Reason for Consult:  Intentional overdose Referring Physician:  EDP Patient Identification: Jose Zimmerman MRN:  [id number] Principal Diagnosis: Overdose Diagnosis:  Principal Problem:   Overdose Active Problems:   Major depressive disorder, single episode, severe without psychosis ([place])   Total Time spent with patient: 1 hour  Subjective:   Jose Zimmerman is a 34 y.o. male patient admitted with intentional overdose.  Patient seen and evaluated in person by this provider.  Attempted to see him earlier this morning but unresponsive.  Brought in by EMS after his father found him in the middle the night passed out in a kneeling position in the living room.  Patient on assessment reports he does not remember what happened.  He denies this was a suicide attempt, denies suicidal/homicidal ideations, hallucinations, and substance abuse.  He reports he is currently living with his parents as he is estranged from his wife and was watching television last night.  He does report seeing a psychiatrist in town and taking BuSpar for anxiety and amitriptyline for sleep.  Reports taking these appropriately.  His father was contacted and reports what happened when he found him.  He does report that they were able to get into his phone today and he had sent his ex-wife a message saying that he hoped his son turned out better than he did and this was an attempt to end his life.  Patient will be admitted psychiatrically for stabilization.  HPI per EDP:  Jose Zimmerman is a 34 y.o. male who presents by EMS for decreased level of responsiveness and suspected intentional overdose.  According to EMS he was found by his family, slumped over and minimally responsive with vomit all around him.  They said that he has no prior history of intentional overdose but he has been going through a bad time recently with an ongoing divorce and that he does have a history of anxiety and  depression.  EMS states that when they arrived he was minimally responsive but did respond to pain but was very combative, thrashing out of them in a nonpurposeful way and would not follow instructions.  They counted the medications and his containers and found that he had 17 tablets of buspirone 10 mg missing from his pill bottle and 7 missing tablets of amitriptyline 50 mg based on the dates the prescriptions were filled and how many he should have taken.  No other medications or bottles were around.  The patient received Benadryl 50 mg IV in route to the hospital because of the patient's thrashing and concerns on the part of the paramedics that he was having extraparametal symptoms.  Upon arrival in the ED the patient will not respond to any commands but is combative to painful stimuli and attempts to redirect him.  Past Psychiatric History: Depression and anxiety Risk to Self:  Yes Risk to Others:  None Prior Inpatient Therapy:  None Prior Outpatient Therapy:  Yes Past Medical History:  Past Medical History:  Diagnosis Date  . Anxiety   . Depression   . GERD (gastroesophageal reflux disease)   . Vertigo     Past Surgical History:  Procedure Laterality Date  . ULNAR NERVE TRANSPOSITION Left 2009   Family History:  Family History  Problem Relation Age of Onset  . Osteoporosis Mother   . Diabetes Father   . Hypertension Father   . Hyperlipidemia Father   . Crohn's disease Other    Family Psychiatric  History: None Social History:  Social History   Substance and Sexual Activity  Alcohol Use Yes   Comment: quit June 2018     Social History   Substance and Sexual Activity  Drug Use No    Social History   Socioeconomic History  . Marital status: Married    Spouse name: Not on file  . Number of children: Not on file  . Years of education: Not on file  . Highest education level: Not on file  Occupational History  . Not on file  Tobacco Use  . Smoking status:  Current Every Day Smoker    Packs/day: 0.50    Years: 10.00    Pack years: 5.00    Types: Cigarettes  . Smokeless tobacco: Former Engineer, water and Sexual Activity  . Alcohol use: Yes    Comment: quit June 2018  . Drug use: No  . Sexual activity: Yes  Other Topics Concern  . Not on file  Social History Narrative  . Not on file   Social Determinants of Health   Financial Resource Strain:   . Difficulty of Paying Living Expenses: Not on file  Food Insecurity:   . Worried About Programme researcher, broadcasting/film/video in the Last Year: Not on file  . Ran Out of Food in the Last Year: Not on file  Transportation Needs:   . Lack of Transportation (Medical): Not on file  . Lack of Transportation (Non-Medical): Not on file  Physical Activity:   . Days of Exercise per Week: Not on file  . Minutes of Exercise per Session: Not on file  Stress:   . Feeling of Stress : Not on file  Social Connections:   . Frequency of Communication with Friends and Family: Not on file  . Frequency of Social Gatherings with Friends and Family: Not on file  . Attends Religious Services: Not on file  . Active Member of Clubs or Organizations: Not on file  . Attends Banker Meetings: Not on file  . Marital Status: Not on file   Additional Social History:    Allergies:   Allergies  Allergen Reactions  . Gabapentin Nausea And Vomiting    Drowsiness, confusion  . Keppra [Levetiracetam] Nausea And Vomiting    Drowsiness, confusion    Labs:  Results for orders placed or performed during the hospital encounter of 09/17/19 (from the past 48 hour(s))  CBC with Differential     Status: Abnormal   Collection Time: 09/17/19  4:04 AM  Result Value Ref Range   WBC 19.5 (H) 4.0 - 10.5 K/uL   RBC 5.46 4.22 - 5.81 MIL/uL   Hemoglobin 16.6 13.0 - 17.0 g/dL   HCT 02.7 74.1 - 28.7 %   MCV 87.2 80.0 - 100.0 fL   MCH 30.4 26.0 - 34.0 pg   MCHC 34.9 30.0 - 36.0 g/dL   RDW 86.7 67.2 - 09.4 %   Platelets 219 150 -  400 K/uL   nRBC 0.0 0.0 - 0.2 %   Neutrophils Relative % 83 %   Neutro Abs 16.1 (H) 1.7 - 7.7 K/uL   Lymphocytes Relative 11 %   Lymphs Abs 2.2 0.7 - 4.0 K/uL   Monocytes Relative 5 %   Monocytes Absolute 1.0 0.1 - 1.0 K/uL   Eosinophils Relative 0 %   Eosinophils Absolute 0.1 0.0 - 0.5 K/uL   Basophils Relative 0 %   Basophils Absolute 0.1 0.0 - 0.1 K/uL   Immature Granulocytes  1 %   Abs Immature Granulocytes 0.09 (H) 0.00 - 0.07 K/uL    Comment: Performed at [facility], [address] [place], [place] [address]  Comprehensive metabolic panel     Status: Abnormal   Collection Time: 09/17/19  4:04 AM  Result Value Ref Range   Sodium 141 135 - 145 mmol/L   Potassium 4.2 3.5 - 5.1 mmol/L    Comment: HEMOLYSIS AT THIS LEVEL MAY AFFECT RESULT   Chloride 107 98 - 111 mmol/L   CO2 23 22 - 32 mmol/L   Glucose, Bld 127 (H) 70 - 99 mg/dL   BUN 15 6 - 20 mg/dL   Creatinine, Ser 0.16 0.61 - 1.24 mg/dL   Calcium 9.3 8.9 - 01.0 mg/dL   Total Protein 7.3 6.5 - 8.1 g/dL   Albumin 4.3 3.5 - 5.0 g/dL   AST 24 15 - 41 U/L   ALT 22 0 - 44 U/L   Alkaline Phosphatase 74 38 - 126 U/L   Total Bilirubin 0.9 0.3 - 1.2 mg/dL   GFR calc non Af Amer >60 >60 mL/min   GFR calc Af Amer >60 >60 mL/min   Anion gap 11 5 - 15    Comment: Performed at [facility], [address] [place], [place] [address]  Protime-INR     Status: None   Collection Time: 09/17/19  4:04 AM  Result Value Ref Range   Prothrombin Time 12.5 11.4 - 15.2 seconds   INR 0.9 0.8 - 1.2    Comment: (NOTE) INR goal varies based on device and disease states. Performed at [facility], [address] [place], [place] [address]   Respiratory Panel by RT PCR (Flu A&B, Covid) - Nasopharyngeal Swab     Status: None   Collection Time: 09/17/19  4:11 AM   Specimen: Nasopharyngeal Swab  Result Value Ref Range   SARS Coronavirus 2 by RT PCR NEGATIVE NEGATIVE    Comment: (NOTE) SARS-CoV-2 target nucleic  acids are NOT DETECTED. The SARS-CoV-2 RNA is generally detectable in upper respiratoy specimens during the acute phase of infection. The lowest concentration of SARS-CoV-2 viral copies this assay can detect is 131 copies/mL. A negative result does not preclude SARS-Cov-2 infection and should not be used as the sole basis for treatment or other patient management decisions. A negative result may occur with  improper specimen collection/handling, submission of specimen other than nasopharyngeal swab, presence of viral mutation(s) within the areas targeted by this assay, and inadequate number of viral copies (<131 copies/mL). A negative result must be combined with clinical observations, patient history, and epidemiological information. The expected result is Negative. Fact Sheet for Patients:  https://www.moore.com/ Fact Sheet for Healthcare Providers:  https://www.young.biz/ This test is not yet ap proved or cleared by the [place] FDA and  has been authorized for detection and/or diagnosis of SARS-CoV-2 by FDA under an Emergency Use Authorization (EUA). This EUA will remain  in effect (meaning this test can be used) for the duration of the COVID-19 declaration under Section 564(b)(1) of the Act, 21 U.S.C. section 360bbb-3(b)(1), unless the authorization is terminated or revoked sooner.    Influenza A by PCR NEGATIVE NEGATIVE   Influenza B by PCR NEGATIVE NEGATIVE    Comment: (NOTE) The Xpert Xpress SARS-CoV-2/FLU/RSV assay is intended as an aid in  the diagnosis of influenza from Nasopharyngeal swab specimens and  should not be used as a sole basis for treatment. Nasal washings and  aspirates are  unacceptable for Xpert Xpress SARS-CoV-2/FLU/RSV  testing. Fact Sheet for Patients: https://www.moore.com/https://www.fda.gov/media/142436/download Fact Sheet for Healthcare Providers: https://www.young.biz/https://www.fda.gov/media/142435/download This test is not yet approved or  cleared by the [place] FDA and  has been authorized for detection and/or diagnosis of SARS-CoV-2 by  FDA under an Emergency Use Authorization (EUA). This EUA will remain  in effect (meaning this test can be used) for the duration of the  Covid-19 declaration under Section 564(b)(1) of the Act, 21  U.S.C. section 360bbb-3(b)(1), unless the authorization is  terminated or revoked. Performed at [facility], [address] [place], [place] [address]   Acetaminophen level     Status: Abnormal   Collection Time: 09/17/19  4:24 AM  Result Value Ref Range   Acetaminophen (Tylenol), Serum <10 (L) 10 - 30 ug/mL    Comment: (NOTE) Therapeutic concentrations vary significantly. A range of 10-30 ug/mL  may be an effective concentration for many patients. However, some  are best treated at concentrations outside of this range. Acetaminophen concentrations >150 ug/mL at 4 hours after ingestion  and >50 ug/mL at 12 hours after ingestion are often associated with  toxic reactions. Performed at [facility], [address] [place], [place] [address]   Ethanol     Status: None   Collection Time: 09/17/19  4:24 AM  Result Value Ref Range   Alcohol, Ethyl (B) <10 <10 mg/dL    Comment: (NOTE) Lowest detectable limit for serum alcohol is 10 mg/dL. For medical purposes only. Performed at [facility], [address] [place], [place] [address]   Salicylate level     Status: Abnormal   Collection Time: 09/17/19  4:24 AM  Result Value Ref Range   Salicylate Lvl <7.0 (L) 7.0 - 30.0 mg/dL    Comment: Performed at [facility], [address] [place], [place] [address]  Urinalysis, Routine w reflex microscopic     Status: Abnormal   Collection Time: 09/17/19  5:21 AM  Result Value Ref Range   Color, Urine AMBER (A) YELLOW    Comment: BIOCHEMICALS MAY BE AFFECTED BY COLOR   APPearance HAZY (A) CLEAR   Specific Gravity, Urine 1.029 1.005 - 1.030   pH  5.0 5.0 - 8.0   Glucose, UA NEGATIVE NEGATIVE mg/dL   Hgb urine dipstick NEGATIVE NEGATIVE   Bilirubin Urine NEGATIVE NEGATIVE   Ketones, ur NEGATIVE NEGATIVE mg/dL   Protein, ur 30 (A) NEGATIVE mg/dL   Nitrite NEGATIVE NEGATIVE   Leukocytes,Ua NEGATIVE NEGATIVE   RBC / HPF 0-5 0 - 5 RBC/hpf   WBC, UA 0-5 0 - 5 WBC/hpf   Bacteria, UA RARE (A) NONE SEEN   Squamous Epithelial / LPF 0-5 0 - 5   Mucus PRESENT     Comment: Performed at [facility], [address] [place], [place] [address]  Urine Drug Screen, Qualitative ([facility] only)     Status: Abnormal   Collection Time: 09/17/19  5:21 AM  Result Value Ref Range   Tricyclic, Ur Screen POSITIVE (A) NONE DETECTED   Amphetamines, Ur Screen NONE DETECTED NONE DETECTED   MDMA (Ecstasy)Ur Screen NONE DETECTED NONE DETECTED   Cocaine Metabolite,Ur [place] NONE DETECTED NONE DETECTED   Opiate, Ur Screen NONE DETECTED NONE DETECTED   Phencyclidine (PCP) Ur S NONE DETECTED NONE DETECTED   Cannabinoid 50 Ng, Ur [place] NONE DETECTED NONE DETECTED   Barbiturates, Ur Screen NONE DETECTED NONE DETECTED   Benzodiazepine, Ur Scrn NONE DETECTED NONE DETECTED   Methadone  Scn, Ur NONE DETECTED NONE DETECTED    Comment: (NOTE) Tricyclics + metabolites, urine    Cutoff 1000 ng/mL Amphetamines + metabolites, urine  Cutoff 1000 ng/mL MDMA (Ecstasy), urine              Cutoff 500 ng/mL Cocaine Metabolite, urine          Cutoff 300 ng/mL Opiate + metabolites, urine        Cutoff 300 ng/mL Phencyclidine (PCP), urine         Cutoff 25 ng/mL Cannabinoid, urine                 Cutoff 50 ng/mL Barbiturates + metabolites, urine  Cutoff 200 ng/mL Benzodiazepine, urine              Cutoff 200 ng/mL Methadone, urine                   Cutoff 300 ng/mL The urine drug screen provides only a preliminary, unconfirmed analytical test result and should not be used for non-medical purposes. Clinical consideration and professional judgment should be applied to any  positive drug screen result due to possible interfering substances. A more specific alternate chemical method must be used in order to obtain a confirmed analytical result. Gas chromatography / mass spectrometry (GC/MS) is the preferred confirmat ory method. Performed at [facility], [address] [place], [place] [address]     No current facility-administered medications for this encounter.   Current Outpatient Medications  Medication Sig Dispense Refill  . omeprazole (PRILOSEC) 20 MG capsule Take 20 mg by mouth daily.     Marland Kitchen. oxyCODONE-acetaminophen (PERCOCET/ROXICET) 5-325 MG tablet Take 1 tablet by mouth every 4 (four) hours as needed for severe pain. 15 tablet 0    Musculoskeletal: Strength & Muscle Tone: within normal limits Gait & Station: normal Patient leans: N/A  Psychiatric Specialty Exam: Physical Exam  Nursing note and vitals reviewed. Constitutional: He is oriented to person, place, and time. He appears well-developed and well-nourished.  HENT:  Head: Normocephalic.  Respiratory: Effort normal.  Musculoskeletal:        General: Normal range of motion.     Cervical back: Normal range of motion.  Neurological: He is alert and oriented to person, place, and time.  Psychiatric: His speech is normal and behavior is normal. His mood appears anxious. His affect is blunt. Cognition and memory are impaired. He expresses impulsivity. He exhibits a depressed mood. He expresses suicidal ideation. He expresses suicidal plans.    Review of Systems  Psychiatric/Behavioral: Positive for dysphoric mood and suicidal ideas. The patient is nervous/anxious.   All other systems reviewed and are negative.   Blood pressure (!) 128/96, pulse 90, temperature (!) 97.4 F (36.3 C), temperature source Axillary, resp. rate (!) 9, height 5\' 9"  (1.753 m), weight 79.4 kg, SpO2 100 %.Body mass index is 25.84 kg/m.  General Appearance: Casual  Eye Contact:  Fair  Speech:  Slow   Volume:  Decreased  Mood:  Anxious and Depressed  Affect:  Congruent  Thought Process:  Coherent and Descriptions of Associations: Intact  Orientation:  Full (Time, Place, and Person)  Thought Content:  Logical  Suicidal Thoughts:  Yes.  with intent/plan  Homicidal Thoughts:  No  Memory:  Immediate;   Fair Recent;   Fair Remote;   Fair  Judgement:  Poor  Insight:  Lacking  Psychomotor Activity:  Decreased  Concentration:  Concentration: Fair and Attention Span: Fair  Recall:  Fair  [employer] of Knowledge:  Fair  Language:  Good  Akathisia:  No  Handed:  Right  AIMS (if indicated):     Assets:  Housing Leisure Time Physical Health Resilience Social Support  ADL's:  Intact  Cognition:  WNL  Sleep:      34 year old male admitted for intentional overdose on TCAs, history of anxiety and depression.  Recently separated from his wife and living with his parents.  Last night, he took an overdose of TCA's but does not recall this nor admits it was a suicide attempt.  However his father was able to get into his phone and found a suicide text to his ex-wife.  Treatment Plan Summary: Daily contact with patient to assess and evaluate symptoms and progress in treatment, Medication management and Plan major depressive disorder, recurrent, severe without psychosis:  -Admit to inpatient psychiatric unit at [facility]  Disposition: Recommend psychiatric Inpatient admission when medically cleared.  Nanine Means, NP 09/17/2019 8:47 AM

## 2019-09-17 NOTE — ED Notes (Signed)
Report given to Gary RN 

## 2019-09-17 NOTE — ED Notes (Signed)
Pt given water and meal tray 

## 2019-09-18 ENCOUNTER — Encounter: Payer: Self-pay | Admitting: Psychiatry

## 2019-09-18 ENCOUNTER — Other Ambulatory Visit: Payer: Self-pay

## 2019-09-18 ENCOUNTER — Inpatient Hospital Stay
Admission: RE | Admit: 2019-09-18 | Discharge: 2019-09-21 | DRG: 885 | Disposition: A | Discharge status: Home / Self Care | Payer: Medicaid Other | Source: Intra-hospital | Attending: Psychiatry | Admitting: Psychiatry

## 2019-09-18 DIAGNOSIS — Z888 Allergy status to other drugs, medicaments and biological substances status: Secondary | ICD-10-CM

## 2019-09-18 DIAGNOSIS — K219 Gastro-esophageal reflux disease without esophagitis: Secondary | ICD-10-CM | POA: Diagnosis present

## 2019-09-18 DIAGNOSIS — G4700 Insomnia, unspecified: Secondary | ICD-10-CM | POA: Diagnosis present

## 2019-09-18 DIAGNOSIS — F4310 Post-traumatic stress disorder, unspecified: Secondary | ICD-10-CM | POA: Diagnosis present

## 2019-09-18 DIAGNOSIS — T43014S Poisoning by tricyclic antidepressants, undetermined, sequela: Secondary | ICD-10-CM | POA: Diagnosis not present

## 2019-09-18 DIAGNOSIS — F1010 Alcohol abuse, uncomplicated: Secondary | ICD-10-CM | POA: Diagnosis present

## 2019-09-18 DIAGNOSIS — Z79899 Other long term (current) drug therapy: Secondary | ICD-10-CM | POA: Diagnosis not present

## 2019-09-18 DIAGNOSIS — T43011A Poisoning by tricyclic antidepressants, accidental (unintentional), initial encounter: Secondary | ICD-10-CM | POA: Diagnosis present

## 2019-09-18 DIAGNOSIS — F410 Panic disorder [episodic paroxysmal anxiety] without agoraphobia: Secondary | ICD-10-CM | POA: Diagnosis present

## 2019-09-18 DIAGNOSIS — G894 Chronic pain syndrome: Secondary | ICD-10-CM | POA: Diagnosis not present

## 2019-09-18 DIAGNOSIS — F332 Major depressive disorder, recurrent severe without psychotic features: Principal | ICD-10-CM | POA: Diagnosis present

## 2019-09-18 DIAGNOSIS — T50901S Poisoning by unspecified drugs, medicaments and biological substances, accidental (unintentional), sequela: Secondary | ICD-10-CM | POA: Diagnosis not present

## 2019-09-18 DIAGNOSIS — F17210 Nicotine dependence, cigarettes, uncomplicated: Secondary | ICD-10-CM | POA: Diagnosis present

## 2019-09-18 DIAGNOSIS — T43012A Poisoning by tricyclic antidepressants, intentional self-harm, initial encounter: Secondary | ICD-10-CM | POA: Diagnosis present

## 2019-09-18 DIAGNOSIS — T50901A Poisoning by unspecified drugs, medicaments and biological substances, accidental (unintentional), initial encounter: Secondary | ICD-10-CM | POA: Diagnosis present

## 2019-09-18 MED ORDER — HYDROXYZINE HCL 25 MG PO TABS: 25.0000 mg | ORAL_TABLET | Freq: Four times a day (QID) | ORAL | Status: DC | PRN

## 2019-09-18 MED ORDER — ALUM & MAG HYDROXIDE-SIMETH 200-200-20 MG/5ML PO SUSP: 30.0000 mL | ORAL | Status: DC | PRN

## 2019-09-18 MED ORDER — TRAZODONE HCL 50 MG PO TABS
50.0000 mg | ORAL_TABLET | Freq: Every evening | ORAL | Status: DC | PRN
  Administered 2019-09-18 – 2019-09-20 (×4): 50 mg via ORAL
  Filled 2019-09-18 (×2): qty 1

## 2019-09-18 MED ORDER — ACETAMINOPHEN 325 MG PO TABS
650.0000 mg | ORAL_TABLET | Freq: Four times a day (QID) | ORAL | Status: DC | PRN
  Administered 2019-09-19: 650 mg via ORAL
  Filled 2019-09-18: qty 2

## 2019-09-18 MED ORDER — BUSPIRONE HCL 5 MG PO TABS
10.0000 mg | ORAL_TABLET | Freq: Three times a day (TID) | ORAL | Status: DC
  Administered 2019-09-18 – 2019-09-21 (×9): 10 mg via ORAL
  Filled 2019-09-18 (×9): qty 2

## 2019-09-18 MED ORDER — NICOTINE POLACRILEX 2 MG MT GUM
2.0000 mg | CHEWING_GUM | OROMUCOSAL | Status: DC | PRN
  Administered 2019-09-18 – 2019-09-21 (×5): 2 mg via ORAL
  Filled 2019-09-18 (×5): qty 1

## 2019-09-18 MED ORDER — MAGNESIUM HYDROXIDE 400 MG/5ML PO SUSP: 30.0000 mL | Freq: Every day | ORAL | Status: DC | PRN

## 2019-09-18 MED ORDER — NICOTINE 21 MG/24HR TD PT24
21.0000 mg | MEDICATED_PATCH | Freq: Every day | TRANSDERMAL | Status: DC
  Administered 2019-09-18 – 2019-09-21 (×4): 21 mg via TRANSDERMAL
  Filled 2019-09-18 (×4): qty 1

## 2019-09-18 NOTE — ED Notes (Signed)
Hourly rounding reveals patient sleeping in room. No complaints, stable, in no acute distress. Q15 minute rounds and monitoring via Rover and Officer to continue.  

## 2019-09-18 NOTE — Plan of Care (Signed)
  Problem: Education: Goal: Emotional status will improve Outcome: Progressing Goal: Mental status will improve Outcome: Progressing   

## 2019-09-18 NOTE — ED Notes (Signed)
Assumed care of patient patient laying in bed, denies any SI/HI/HV this morning. Awaiting plan of care. Safety maintained. Will continue to monitor.
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
## 2019-09-18 NOTE — ED Notes (Signed)
ekg obtained and given to ed provider.

## 2019-09-18 NOTE — BH Assessment (Signed)
Patient is to be admitted to [facility] by Psychiatric Nurse Practitioner Nanine Means, when medically cleared.  Attending Physician will be Dr. Toni Amend.   Patient has been assigned to room 307, by [facility] Charge Nurse Demetria.   ER staff is aware of the admission:  Ronnie, ER Secretary    Lattie Corns, Patient's Nurse   Edson Snowball, Patient Access.

## 2019-09-18 NOTE — Plan of Care (Signed)
Patient just recently admitted to the unit. Patient has not had sufficient time to show progressions at this time. Will continue to monitor for progressions.    Problem: Education: Goal: Emotional status will improve Outcome: Not Progressing Goal: Mental status will improve Outcome: Not Progressing   Problem: Coping: Goal: Ability to verbalize frustrations and anger appropriately will improve Outcome: Not Progressing Goal: Ability to demonstrate self-control will improve Outcome: Not Progressing   Problem: Safety: Goal: Periods of time without injury will increase Outcome: Not Progressing

## 2019-09-18 NOTE — ED Notes (Signed)
Report given to receiving nurse. patient transferred to lower level psych   Unit.

## 2019-09-18 NOTE — ED Provider Notes (Signed)
-----------------------------------------   6:03 AM on 09/18/2019 -----------------------------------------   Blood pressure 125/87, pulse 92, temperature 98.2 F (36.8 C), temperature source Oral, resp. rate 16, height 1.753 m (5\' 9" ), weight 79.4 kg, SpO2 100 %.  The patient is calm and cooperative at this time.  There have been no acute events since the last update.  Awaiting disposition plan from Behavioral Medicine and/or Social Work team(s).   , MD 09/18/19 [phone]
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
## 2019-09-18 NOTE — Tx Team (Signed)
Initial Treatment Plan 09/18/2019 3:50 PM Cristie Hem ZHY:[id number]    PATIENT STRESSORS: Marital or family conflict  Suicide Attempt   PATIENT STRENGTHS: Ability for insight Communication skills Physical Health Special hobby/interest Supportive family/friends Work skills   PATIENT IDENTIFIED PROBLEMS: Suicidal Attempt 09/18/2019  Depression 09/18/2019  Stress 09/18/2019                 DISCHARGE CRITERIA:  Improved stabilization in mood, thinking, and/or behavior Motivation to continue treatment in a less acute level of care Need for constant or close observation no longer present Verbal commitment to aftercare and medication compliance  PRELIMINARY DISCHARGE PLAN: Outpatient therapy Return to previous living arrangement Return to previous work or school arrangements  PATIENT/FAMILY INVOLVEMENT: This treatment plan has been presented to and reviewed with the patient, KAMERON BLETHEN. The patient has been given the opportunity to ask questions and make suggestions.  Lenox Ponds, RN 09/18/2019, 3:50 PM

## 2019-09-18 NOTE — ED Notes (Signed)
Hourly rounding reveals patient awake in room. No complaints, stable, in no acute distress. Q15 minute rounds and monitoring via Rover and Officer to continue.  

## 2019-09-18 NOTE — Progress Notes (Signed)
D: Received patient from [facility] Emergency Department. Patient skin assessment completed with Demetria, RN, skin is intact, no contraband found with all unit prohibited items locked and stored away for discharge. Pt. Was admitted under the services of, Dr. Toni Amend.   Pt. During the admissions process was pleasant and cooperative, but visible nervous. Pt. Denies current si/hi/avh, able to verbalize safety on the unit. Pt. Denies physical pain. Pt. When asked about why he has been brought to the unit is unable to provide any insight into why he is here. Denies knowledge of suicide attempt or being found by EMS.    A: Patient oriented to unit/room/call light. Pt. Given extensive admissions education. Patient was encourage to participate in unit activities and continue with plan of care being put into place. Q x 15 minute observation checks were initiated for safety.   R: Patient is receptive to treatment being put into place and safety to be maintained on unit per MD orders.

## 2019-09-18 NOTE — Progress Notes (Signed)
Patient presents with sad, flat affect but brightens on approach. Denies SI, HI,AVH. Requesting buspar and Amitriptyline at bedtime for sleep.  Visible in milieu. Minimal interaction with staff and peers. Medication given as prescribed. Encouragement and support offered. Safety checks maintained. Pt receptive and remains safe on unit with q 15 min checks.

## 2019-09-19 DIAGNOSIS — F332 Major depressive disorder, recurrent severe without psychotic features: Principal | ICD-10-CM

## 2019-09-19 LAB — CBC WITH DIFFERENTIAL/PLATELET
Abs Immature Granulocytes: 0.04 10*3/uL (ref 0.00–0.07)
Basophils Absolute: 0 10*3/uL (ref 0.0–0.1)
Basophils Relative: 0 %
Eosinophils Absolute: 0.1 10*3/uL (ref 0.0–0.5)
Eosinophils Relative: 1 %
HCT: 45.7 % (ref 39.0–52.0)
Hemoglobin: 15.8 g/dL (ref 13.0–17.0)
Immature Granulocytes: 1 %
Lymphocytes Relative: 32 %
Lymphs Abs: 2.8 10*3/uL (ref 0.7–4.0)
MCH: 30.3 pg (ref 26.0–34.0)
MCHC: 34.6 g/dL (ref 30.0–36.0)
MCV: 87.5 fL (ref 80.0–100.0)
Monocytes Absolute: 0.7 10*3/uL (ref 0.1–1.0)
Monocytes Relative: 8 %
Neutro Abs: 5.1 10*3/uL (ref 1.7–7.7)
Neutrophils Relative %: 58 %
Platelets: 237 10*3/uL (ref 150–400)
RBC: 5.22 MIL/uL (ref 4.22–5.81)
RDW: 12.5 % (ref 11.5–15.5)
WBC: 8.7 10*3/uL (ref 4.0–10.5)
nRBC: 0 % (ref 0.0–0.2)

## 2019-09-19 MED ORDER — DULOXETINE HCL 30 MG PO CPEP
30.0000 mg | ORAL_CAPSULE | Freq: Every day | ORAL | Status: DC
  Administered 2019-09-19 – 2019-09-20 (×2): 30 mg via ORAL
  Filled 2019-09-19 (×2): qty 1

## 2019-09-19 MED ORDER — ENSURE ENLIVE PO LIQD
237.0000 mL | Freq: Two times a day (BID) | ORAL | Status: DC
  Administered 2019-09-19 – 2019-09-21 (×4): 237 mL via ORAL

## 2019-09-19 MED ORDER — ADULT MULTIVITAMIN W/MINERALS CH
1.0000 | ORAL_TABLET | Freq: Every day | ORAL | Status: DC
  Administered 2019-09-20 – 2019-09-21 (×2): 1 via ORAL
  Filled 2019-09-19 (×2): qty 1

## 2019-09-19 NOTE — Plan of Care (Signed)
Patient is appropriate in the unit.Pleasant and cooperative on approach.Patient denies SI,HI and AVH.Stated that he have a son to take care of.Compliant with medications.Attended groups.Appetite and energy level good.Support and encouragement given.

## 2019-09-19 NOTE — Progress Notes (Signed)
Initial Nutrition Assessment  DOCUMENTATION CODES:   Not applicable  INTERVENTION:   Ensure Enlive po BID, each supplement provides 350 kcal and 20 grams of protein  MVI daily   Liberalize diet   NUTRITION DIAGNOSIS:   Predicted suboptimal nutrient intake related to social / environmental circumstances as evidenced by other (comment)(per chart review).  GOAL:   Patient will meet greater than or equal to 90% of their needs  MONITOR:   PO intake, Supplement acceptance  REASON FOR ASSESSMENT:   Malnutrition Screening Tool    ASSESSMENT:   34 year old male with a reported past psychiatric history significant for posttraumatic stress disorder and etoh abuse who presented to the [facility] emergency department on 09/18/2019 after an intentional overdose.   Suspect pt with decreased appetite and oral intake pta r/t depression. There are no documented meal intakes from this admit. RD will add supplements and MVI to help pt meet his estimated needs. Per chart, pt appears fairly weight stable at baseline. Pt's UBW appears to be 160lbs; RD unsure if admit weight is correct.   Medications reviewed and include: nicotine   Labs reviewed:   Diet Order:   Diet Order            Diet regular Room service appropriate? Yes; Fluid consistency: Thin  Diet effective now             EDUCATION NEEDS:   No education needs have been identified at this time  Skin:  Skin Assessment: Reviewed RN Assessment  Last BM:  1/2- last documented  Height:   Ht Readings from Last 1 Encounters:  09/18/19 5\' 9"  (1.753 m)    Weight:   Wt Readings from Last 1 Encounters:  09/18/19 59.4 kg    Ideal Body Weight:  72.7 kg  BMI:  Body mass index is 19.35 kg/m.  Estimated Nutritional Needs:   Kcal:  2000-2300kcal/day  Protein:  100-115g/day  Fluid:  >1.8L/day  11/16/19 MS, RD, LDN Pager #- [phone] Office#- [phone] After Hours Pager:  [phone]

## 2019-09-19 NOTE — [facility] Suicide Risk Assessment (Signed)
[facility] INPATIENT:  Family/Significant Other Suicide Prevention Education  Suicide Prevention Education:  Patient Refusal for Family/Significant Other Suicide Prevention Education: The patient Jose Zimmerman has refused to provide written consent for family/significant other to be provided Family/Significant Other Suicide Prevention Education during admission and/or prior to discharge.  Physician notified.  SPE completed with pt, as pt refused to consent to family contact. SPI pamphlet provided to pt and pt was encouraged to share information with support network, ask questions, and talk about any concerns relating to SPE. Pt denies access to guns/firearms and verbalized understanding of information provided. Mobile Crisis information also provided to pt.    Charlann Lange Blong Busk MSW LCSW 09/19/2019, 9:38 AM

## 2019-09-19 NOTE — [facility] Group Notes (Signed)
Overcoming Obstacles  09/19/2019 1PM  Type of Therapy and Topic:  Group Therapy:  Overcoming Obstacles  Participation Level:  Active    Description of Group:    In this group patients will be encouraged to explore what they see as obstacles to their own wellness and recovery. They will be guided to discuss their thoughts, feelings, and behaviors related to these obstacles. The group will process together ways to cope with barriers, with attention given to specific choices patients can make. Each patient will be challenged to identify changes they are motivated to make in order to overcome their obstacles. This group will be process-oriented, with patients participating in exploration of their own experiences as well as giving and receiving support and challenge from other group members.   Therapeutic Goals: 1. Patient will identify personal and current obstacles as they relate to admission. 2. Patient will identify barriers that currently interfere with their wellness or overcoming obstacles.  3. Patient will identify feelings, thought process and behaviors related to these barriers. 4. Patient will identify two changes they are willing to make to overcome these obstacles:      Summary of Patient Progress Actively and appropriately engaged in the group. Patient was able to provide support and validation to other group members.Patient participated in group activity and discussed with group his protective factors and ways to improve factors. 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 Patient demonstrated good insight and respected boundaries.    Therapeutic Modalities:   Cognitive Behavioral Therapy Solution Focused Therapy Motivational Interviewing Relapse Prevention Therapy    Lowella Dandy, MSW, LCSW 09/19/2019 1:47 PM

## 2019-09-19 NOTE — Progress Notes (Signed)
Recreation Therapy Notes  Date: 09/18/2018  Time: 9:30 am  Location: Craft room  Behavioral response: Appropriate  Intervention Topic: Relaxation    Discussion/Intervention:  Group content today was focused on relaxation. The group defined relaxation and identified healthy ways to relax. Individuals expressed how much time they spend relaxing. Patients expressed how much their life would be if they did not make time for themselves to relax. The group stated ways they could improve their relaxation techniques in the future.  Individuals participated in the intervention "Time to Relax" where they had a chance to experience different relaxation techniques.  Clinical Observations/Feedback:  Patient came to group and defined relaxation as fishing. 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 Individual participated in the intervention during group and was social with peers and staff. Jayvien Rowlette LRT/CTRS         Chrishonda Hesch 09/19/2019 11:36 AM
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
## 2019-09-20 MED ORDER — TRAZODONE HCL 100 MG PO TABS
100.0000 mg | ORAL_TABLET | Freq: Every evening | ORAL | Status: DC | PRN
  Administered 2019-09-20: 22:00:00 100 mg via ORAL
  Filled 2019-09-20: qty 1

## 2019-09-20 MED ORDER — DULOXETINE HCL 30 MG PO CPEP
60.0000 mg | ORAL_CAPSULE | Freq: Every day | ORAL | Status: DC
  Administered 2019-09-21: 08:00:00 60 mg via ORAL
  Filled 2019-09-20: qty 2

## 2019-09-20 NOTE — Plan of Care (Signed)
D: Pt. During assessments this morning denies SI/HI/AVH, able to contract for safety. Pt. Endorses a normal mood. Pt. Denies pain. Pt. Is pleasant and cooperative, engagement is fair. No behavioral concerns to report.   A: Q x 15 minute observation checks in place for safety. Patient was and is provided with education throughout shift.  Patient was and will be given/offered medications per orders. Patient was and is encouraged to attend groups, participate in unit activities and continue with plan of care. Pt. Chart and plans of care reviewed. Pt. Given support and encouragement.   R: Patient is complaint with medication and unit procedures. Pt. Observed eating good. Pt. Out in the milieu often socializing with peers and staff. Pt. Attends groups.     Problem: Education: Goal: Emotional status will improve Outcome: Progressing Goal: Mental status will improve Outcome: Progressing   Problem: Coping: Goal: Ability to verbalize frustrations and anger appropriately will improve Outcome: Progressing Goal: Ability to demonstrate self-control will improve Outcome: Progressing   Problem: Safety: Goal: Periods of time without injury will increase Outcome: Progressing   Problem: Education: Goal: Knowledge of [facility] Education information/materials will improve Outcome: Progressing

## 2019-09-20 NOTE — Progress Notes (Signed)
[facility] MD Progress Note  09/20/2019 2:16 PM Jose Zimmerman  MRN:  [id number]   Subjective: Follow-up for this patient with MDD severe recurrent. Patient reports today that he feels that things are improving some for him.  He states that he is not having any side effects to the medications and feels that they will help him some.  He is interested in having had his Cymbalta increased as he was told that it could potentially help with his chronic pain.  He continues to report that he was dealing with some depression prior to coming to the hospital but never had any thoughts of suicide.  He states that he has not drank since October 31 and that he has not had any type of experience like this before when he was not drinking alcohol.  He states his depression was coming from the separation from his wife.  Patient states that he does not remember taking any amitriptyline and never had any thoughts of overdosing.  He states that the only thing he notices that he must have blacked out when all of this happened and he does not understand why.  Patient continues to deny having any suicidal or homicidal ideations and denies any hallucinations.  Patient reports that his sleep and his appetite has been fair.  [place] his sleep has been disrupted is curious about having some additional medication to assist with his sleep.  Principal Problem: Major depressive disorder, recurrent severe without psychotic features ([place]) Diagnosis: Principal Problem:   Major depressive disorder, recurrent severe without psychotic features ([place]) Active Problems:   Chronic pain syndrome   Overdose   Tricyclic overdose  Total Time spent with patient: 30 minutes  Past Psychiatric History: PTSD, depression, and anxiety  Past Medical History:  Past Medical History:  Diagnosis Date  . Anxiety   . Depression   . GERD (gastroesophageal reflux disease)   . Vertigo     Past Surgical History:  Procedure Laterality Date  . ULNAR NERVE  TRANSPOSITION Left 2009   Family History:  Family History  Problem Relation Age of Onset  . Osteoporosis Mother   . Diabetes Father   . Hypertension Father   . Hyperlipidemia Father   . Crohn's disease Other    Family Psychiatric  History: None reported  Social History:  Social History   Substance and Sexual Activity  Alcohol Use Not Currently   Comment: quit June 2018     Social History   Substance and Sexual Activity  Drug Use No    Social History   Socioeconomic History  . Marital status: Married    Spouse name: Not on file  . Number of children: Not on file  . Years of education: Not on file  . Highest education level: Not on file  Occupational History  . Not on file  Tobacco Use  . Smoking status: Current Every Day Smoker    Packs/day: 2.00    Years: 10.00    Pack years: 20.00    Types: Cigarettes  . Smokeless tobacco: Former Network engineer and Sexual Activity  . Alcohol use: Not Currently    Comment: quit June 2018  . Drug use: No  . Sexual activity: Yes  Other Topics Concern  . Not on file  Social History Narrative  . Not on file   Social Determinants of Health   Financial Resource Strain:   . Difficulty of Paying Living Expenses: Not on file  Food Insecurity:   . Worried About  Running Out of Food in the Last Year: Not on file  . Ran Out of Food in the Last Year: Not on file  Transportation Needs:   . Lack of Transportation (Medical): Not on file  . Lack of Transportation (Non-Medical): Not on file  Physical Activity:   . Days of Exercise per Week: Not on file  . Minutes of Exercise per Session: Not on file  Stress:   . Feeling of Stress : Not on file  Social Connections:   . Frequency of Communication with Friends and Family: Not on file  . Frequency of Social Gatherings with Friends and Family: Not on file  . Attends Religious Services: Not on file  . Active Member of Clubs or Organizations: Not on file  . Attends Banker  Meetings: Not on file  . Marital Status: Not on file   Additional Social History:                         Sleep: Fair  Appetite:  Fair  Current Medications: Current Facility-Administered Medications  Medication Dose Route Frequency Provider Last Rate Last Admin  . acetaminophen (TYLENOL) tablet 650 mg  650 mg Oral Q6H PRN Charm Rings, NP   650 mg at 09/19/19 0813  . alum & mag hydroxide-simeth (MAALOX/MYLANTA) 200-200-20 MG/5ML suspension 30 mL  30 mL Oral Q4H PRN Charm Rings, NP      . busPIRone (BUSPAR) tablet 10 mg  10 mg Oral TID Mariel Craft, MD   10 mg at 09/20/19 1215  . [START ON 09/21/2019] DULoxetine (CYMBALTA) DR capsule 60 mg  60 mg Oral Daily Raina Sole, Gerlene Burdock, FNP      . feeding supplement (ENSURE ENLIVE) (ENSURE ENLIVE) liquid 237 mL  237 mL Oral BID BM Mariel Craft, MD   237 mL at 09/20/19 1352  . hydrOXYzine (ATARAX/VISTARIL) tablet 25 mg  25 mg Oral Q6H PRN Mariel Craft, MD      . magnesium hydroxide (MILK OF MAGNESIA) suspension 30 mL  30 mL Oral Daily PRN Charm Rings, NP      . multivitamin with minerals tablet 1 tablet  1 tablet Oral Daily Mariel Craft, MD   1 tablet at 09/20/19 0801  . nicotine (NICODERM CQ - dosed in mg/24 hours) patch 21 mg  21 mg Transdermal Daily Mariel Craft, MD   21 mg at 09/20/19 0801  . nicotine polacrilex (NICORETTE) gum 2 mg  2 mg Oral PRN Mariel Craft, MD   2 mg at 09/20/19 1217  . traZODone (DESYREL) tablet 50 mg  50 mg Oral QHS,MR X 1 Mariel Craft, MD   50 mg at 09/20/19 0048    Lab Results:  Results for orders placed or performed during the hospital encounter of 09/18/19 (from the past 48 hour(s))  CBC with Differential/Platelet     Status: None   Collection Time: 09/19/19  8:09 AM  Result Value Ref Range   WBC 8.7 4.0 - 10.5 K/uL   RBC 5.22 4.22 - 5.81 MIL/uL   Hemoglobin 15.8 13.0 - 17.0 g/dL   HCT 91.6 38.4 - 66.5 %   MCV 87.5 80.0 - 100.0 fL   MCH 30.3 26.0 - 34.0 pg   MCHC 34.6  30.0 - 36.0 g/dL   RDW 99.3 57.0 - 17.7 %   Platelets 237 150 - 400 K/uL   nRBC 0.0 0.0 - 0.2 %  Neutrophils Relative % 58 %   Neutro Abs 5.1 1.7 - 7.7 K/uL   Lymphocytes Relative 32 %   Lymphs Abs 2.8 0.7 - 4.0 K/uL   Monocytes Relative 8 %   Monocytes Absolute 0.7 0.1 - 1.0 K/uL   Eosinophils Relative 1 %   Eosinophils Absolute 0.1 0.0 - 0.5 K/uL   Basophils Relative 0 %   Basophils Absolute 0.0 0.0 - 0.1 K/uL   Immature Granulocytes 1 %   Abs Immature Granulocytes 0.04 0.00 - 0.07 K/uL    Comment: Performed at [facility], [address] [place], [place] [address]    Blood Alcohol level:  Lab Results  Component Value Date   [facility] <10 09/17/2019    Metabolic Disorder Labs: No results found for: HGBA1C, MPG No results found for: PROLACTIN No results found for: CHOL, TRIG, HDL, CHOLHDL, VLDL, LDLCALC  Physical Findings: AIMS:  , ,  ,  ,    CIWA:    COWS:     Musculoskeletal: Strength & Muscle Tone: within normal limits Gait & Station: normal Patient leans: N/A  Psychiatric Specialty Exam: Physical Exam  Nursing note and vitals reviewed. Constitutional: He is oriented to person, place, and time. He appears well-developed and well-nourished.  Cardiovascular: Normal rate.  Respiratory: Effort normal.  Musculoskeletal:        General: Normal range of motion.  Neurological: He is alert and oriented to person, place, and time.  Skin: Skin is warm.    Review of Systems  Constitutional: Negative.   HENT: Negative.   Eyes: Negative.   Respiratory: Negative.   Cardiovascular: Negative.   Gastrointestinal: Negative.   Genitourinary: Negative.   Musculoskeletal: Negative.   Skin: Negative.   Neurological: Negative.   Psychiatric/Behavioral: The patient is nervous/anxious.     Blood pressure 120/89, pulse 93, temperature 97.8 F (36.6 C), temperature source Oral, resp. rate 18, height 5\' 9"  (1.753 m), weight 59.4 kg, SpO2 100 %.Body mass index is 19.35  kg/m.  General Appearance: Casual and Fairly Groomed  Eye Contact:  Good  Speech:  Clear and Coherent and Normal Rate  Volume:  Normal  Mood:  Anxious and Depressed  Affect:  Congruent  Thought Process:  Coherent and Descriptions of Associations: Intact  Orientation:  Full (Time, Place, and Person)  Thought Content:  WDL  Suicidal Thoughts:  No  Homicidal Thoughts:  No  Memory:  Immediate;   Good Recent;   Poor Remote;   Good  Judgement:  Fair  Insight:  Fair  Psychomotor Activity:  Normal  Concentration:  Concentration: Good  Recall:  Good  Fund of Knowledge:  Good  Language:  Good  Akathisia:  No  Handed:  Right  AIMS (if indicated):     Assets:  Communication Skills Desire for Improvement Housing Physical Health Resilience Social Support  ADL's:  Intact  Cognition:  WNL  Sleep:  Number of Hours: 8   Assessment: Patient presents in the day room and is interacting with peers and staff appropriately.  Patient is pleasant, calm, cooperative.  Patient has congruent affect and good eye contact.  Patient is able to laugh and smile about certain things that he enjoys and also shows some sadness when discussing his separation from his wife and not being able to see his 53-year-old son.  Patient is showing some significant improvement with medications and being in the hospital.  At this time we will increase his trazodone to 100 mg p.o. nightly as needed to  assist with his disrupted sleep and will increase his Cymbalta to 60 mg starting tomorrow to assist with his depression is chronic pain.  Treatment Plan Summary: Daily contact with patient to assess and evaluate symptoms and progress in treatment and Medication management  Increase trazodone to 100 mg p.o. nightly as needed for insomnia Increase Cymbalta 60 mg p.o. daily starting tomorrow for MDD, chronic pain, and anxiety Continue BuSpar 10 mg p.o. 3 times daily for anxiety Continue Vistaril 25 mg p.o. 3 times daily as needed  for anxiety Encourage group therapy participation Continue every 15 minute safety checks  Maryfrances Bunnell, FNP 09/20/2019, 2:16 PM

## 2019-09-20 NOTE — Progress Notes (Signed)
Recreation Therapy Notes  Date: 09/20/2019  Time: 9:30 am  Location: Craft room  Behavioral response: Appropriate  Intervention Topic: Problem Solving    Discussion/Intervention:  Group content on today was focused on problem solving. The group described what problem solving is. Patients expressed how problems affect them and how they deal with problems. Individuals identified healthy ways to deal with problems. Patients explained what normally happens to them when they do not deal with problems. The group expressed reoccurring problems for them. The group participated in the intervention "Ways to Solve problems" where patients were given a chance to explore different ways to solve problems.  Clinical Observations/Feedback:  Patient came to group and defined problem solving as finding ways to solve your issues, something like math. He expressed that he likes to listen to music and talk to others when he has a problem. Participant stated that problem solving is important, so problems do not merge into anger. Individual participated in the intervention during group and was social with peers and staff. Jose Zimmerman LRT/CTRS         Ilona Colley 09/20/2019 11:46 AM

## 2019-09-20 NOTE — Progress Notes (Signed)
Pt unable to identify who his current provider is. Pt described location being by [employer] [facility] and reported it is listed on his medication bottle that he brought to the hospital with him. CSW contacted Pharmacy to obtain physician's name and was told it was prescribed by [name] and did not list an agency. CSW contacted [facility], [facility], and [facility] to inquire if they have a Dr Milus Banister as a provider. They all reported they do not have a provider by that name.   Iris Pert, MSW, LCSW Clinical Social Work 09/20/2019 3:12 PM

## 2019-09-20 NOTE — [facility] Group Notes (Signed)
LCSW Group Therapy Note  09/20/2019 2:11 PM  Type of Therapy/Topic:  Group Therapy:  Feelings about Diagnosis  Participation Level:  Active   Description of Group:   This group will allow patients to explore their thoughts and feelings about diagnoses they have received. Patients will be guided to explore their level of understanding and acceptance of these diagnoses. Facilitator will encourage patients to process their thoughts and feelings about the reactions of others to their diagnosis and will guide patients in identifying ways to discuss their diagnosis with significant others in their lives. This group will be process-oriented, with patients participating in exploration of their own experiences, giving and receiving support, and processing challenge from other group members.   Therapeutic Goals: 1. Patient will demonstrate understanding of diagnosis as evidenced by identifying two or more symptoms of the disorder 2. Patient will be able to express two feelings regarding the diagnosis 3. Patient will demonstrate their ability to communicate their needs through discussion and/or role play  Summary of Patient Progress: Patient was present in group. Patient participated in group discussions and was supportive of group members.  Patient reports that he was recently diagnosed.  Patient shared that he has not yet shared this information with his family. Patient shared that he engages in "adrenaline" coping skills.  CSW discussed with patient incorporating healthy and safe coping skills.  CSW and other group members encouraged patient in identifying healthier coping skills.    Therapeutic Modalities:   Cognitive Behavioral Therapy Brief Therapy Feelings Identification   Penni Homans, MSW, LCSW 09/20/2019 2:11 PM

## 2019-09-20 NOTE — Tx Team (Cosign Needed)
Interdisciplinary Treatment and Diagnostic Plan Update  09/20/2019 Time of Session: 9am Jose Zimmerman MRN: [id number]  Principal Diagnosis: Major depressive disorder, recurrent severe without psychotic features ([place])  Secondary Diagnoses: Principal Problem:   Major depressive disorder, recurrent severe without psychotic features ([place]) Active Problems:   Chronic pain syndrome   Overdose   Tricyclic overdose   Current Medications:  Current Facility-Administered Medications  Medication Dose Route Frequency Provider Last Rate Last Admin  . acetaminophen (TYLENOL) tablet 650 mg  650 mg Oral Q6H PRN Patrecia Pour, NP   650 mg at 09/19/19 0813  . alum & mag hydroxide-simeth (MAALOX/MYLANTA) 200-200-20 MG/5ML suspension 30 mL  30 mL Oral Q4H PRN Patrecia Pour, NP      . busPIRone (BUSPAR) tablet 10 mg  10 mg Oral TID Lavella Hammock, MD   10 mg at 09/20/19 0801  . DULoxetine (CYMBALTA) DR capsule 30 mg  30 mg Oral Daily Sharma Covert, MD   30 mg at 09/20/19 0801  . feeding supplement (ENSURE ENLIVE) (ENSURE ENLIVE) liquid 237 mL  237 mL Oral BID BM Lavella Hammock, MD   237 mL at 09/20/19 0935  . hydrOXYzine (ATARAX/VISTARIL) tablet 25 mg  25 mg Oral Q6H PRN Lavella Hammock, MD      . magnesium hydroxide (MILK OF MAGNESIA) suspension 30 mL  30 mL Oral Daily PRN Patrecia Pour, NP      . multivitamin with minerals tablet 1 tablet  1 tablet Oral Daily Lavella Hammock, MD   1 tablet at 09/20/19 0801  . nicotine (NICODERM CQ - dosed in mg/24 hours) patch 21 mg  21 mg Transdermal Daily Lavella Hammock, MD   21 mg at 09/20/19 0801  . nicotine polacrilex (NICORETTE) gum 2 mg  2 mg Oral PRN Lavella Hammock, MD   2 mg at 09/19/19 9735  . traZODone (DESYREL) tablet 50 mg  50 mg Oral QHS,MR X 1 Lavella Hammock, MD   50 mg at 09/20/19 0048   PTA Medications: Medications Prior to Admission  Medication Sig Dispense Refill Last Dose  . amitriptyline (ELAVIL) 50 MG tablet Take 50 mg by  mouth at bedtime.     . busPIRone (BUSPAR) 10 MG tablet Take 10 mg by mouth.       Patient Stressors: Marital or family conflict  Patient Strengths: Ability for insight Communication skills Physical Health Special hobby/interest Supportive family/friends Work skills  Treatment Modalities: Medication Management, Group therapy, Case management,  1 to 1 session with clinician, Psychoeducation, Recreational therapy.   Physician Treatment Plan for Primary Diagnosis: Major depressive disorder, recurrent severe without psychotic features ([place]) Long Term Goal(s): Improvement in symptoms so as ready for discharge Improvement in symptoms so as ready for discharge   Short Term Goals: Ability to identify changes in lifestyle to reduce recurrence of condition will improve Ability to verbalize feelings will improve Ability to disclose and discuss suicidal ideas Ability to identify and develop effective coping behaviors will improve Ability to identify triggers associated with substance abuse/mental health issues will improve Ability to identify changes in lifestyle to reduce recurrence of condition will improve Ability to verbalize feelings will improve Ability to disclose and discuss suicidal ideas Ability to identify and develop effective coping behaviors will improve Ability to identify triggers associated with substance abuse/mental health issues will improve  Medication Management: Evaluate patient's response, side effects, and tolerance of medication regimen.  Therapeutic Interventions: 1 to 1 sessions, Unit Group  sessions and Medication administration.  Evaluation of Outcomes: Not Met  Physician Treatment Plan for Secondary Diagnosis: Principal Problem:   Major depressive disorder, recurrent severe without psychotic features ([place]) Active Problems:   Chronic pain syndrome   Overdose   Tricyclic overdose  Long Term Goal(s): Improvement in symptoms so as ready for  discharge Improvement in symptoms so as ready for discharge   Short Term Goals: Ability to identify changes in lifestyle to reduce recurrence of condition will improve Ability to verbalize feelings will improve Ability to disclose and discuss suicidal ideas Ability to identify and develop effective coping behaviors will improve Ability to identify triggers associated with substance abuse/mental health issues will improve Ability to identify changes in lifestyle to reduce recurrence of condition will improve Ability to verbalize feelings will improve Ability to disclose and discuss suicidal ideas Ability to identify and develop effective coping behaviors will improve Ability to identify triggers associated with substance abuse/mental health issues will improve     Medication Management: Evaluate patient's response, side effects, and tolerance of medication regimen.  Therapeutic Interventions: 1 to 1 sessions, Unit Group sessions and Medication administration.  Evaluation of Outcomes: Not Met   RN Treatment Plan for Primary Diagnosis: Major depressive disorder, recurrent severe without psychotic features ([place]) Long Term Goal(s): Knowledge of disease and therapeutic regimen to maintain health will improve  Short Term Goals: Ability to participate in decision making will improve, Ability to verbalize feelings will improve, Ability to disclose and discuss suicidal ideas, Ability to identify and develop effective coping behaviors will improve and Compliance with prescribed medications will improve  Medication Management: RN will administer medications as ordered by provider, will assess and evaluate patient's response and provide education to patient for prescribed medication. RN will report any adverse and/or side effects to prescribing provider.  Therapeutic Interventions: 1 on 1 counseling sessions, Psychoeducation, Medication administration, Evaluate responses to treatment, Monitor vital  signs and CBGs as ordered, Perform/monitor CIWA, COWS, AIMS and Fall Risk screenings as ordered, Perform wound care treatments as ordered.  Evaluation of Outcomes: Not Met   LCSW Treatment Plan for Primary Diagnosis: Major depressive disorder, recurrent severe without psychotic features ([place]) Long Term Goal(s): Safe transition to appropriate next level of care at discharge, Engage patient in therapeutic group addressing interpersonal concerns.  Short Term Goals: Engage patient in aftercare planning with referrals and resources  Therapeutic Interventions: Assess for all discharge needs, 1 to 1 time with Social worker, Explore available resources and support systems, Assess for adequacy in community support network, Educate family and significant other(s) on suicide prevention, Complete Psychosocial Assessment, Interpersonal group therapy.  Evaluation of Outcomes: Not Met   Progress in Treatment: Attending groups: Yes. Participating in groups: Yes. Taking medication as prescribed: Yes. Toleration medication: Yes. Family/Significant other contact made: Yes, individual(s) contacted:  with patient, declined family contact Patient understands diagnosis: Yes. Discussing patient identified problems/goals with staff: Yes. Medical problems stabilized or resolved: No. Denies suicidal/homicidal ideation: Yes. Issues/concerns per patient self-inventory: No. Other: NA  New problem(s) identified: No, Describe:  none reported  New Short Term/Long Term Goal(s):Attend outpatient treatment, take medication as prescribed, develop and implement healthy coping methods  Patient Goals:  "Get better"  Discharge Plan or Barriers: Patient will return home and follow up with outpatient treatment.  Reason for Continuation of Hospitalization: Depression Medication stabilization  Estimated Length of Stay:1-7 days  Attendees: Patient:Jose Zimmerman 09/20/2019 10:38 AM  Physician: Myles Lipps 09/20/2019 10:38  AM  Nursing: Nat Math [place] 09/20/2019 10:38 AM  RN Care Manager: 09/20/2019 10:38 AM  Social Worker: Sanjuana Kava [facility] 09/20/2019 10:38 AM  Recreational Therapist: Roanna Epley 09/20/2019 10:38 AM  Other:  09/20/2019 10:38 AM  Other:  09/20/2019 10:38 AM  Other: 09/20/2019 10:38 AM    Scribe for Treatment Team: Yvette Rack, LCSW 09/20/2019 10:38 AM

## 2019-09-20 NOTE — Progress Notes (Signed)
D: Patient denied SI. Talked about his overdose. Stated he did not intend to make a suicide attempt but instead was trying to get to sleep, which is why he was prescribed amitryptiline, and does not remember overdosing. Pleasant and cooperative. Socializing and playing cards with peers in the dayroom. A: Continue to monitor for safety R: Safety maintained.

## 2019-09-20 NOTE — Plan of Care (Signed)
  Problem: Education: Goal: Emotional status will improve Outcome: Progressing Goal: Mental status will improve Outcome: Progressing  D: Patient denied SI. Talked about his overdose. Stated he did not intend to make a suicide attempt but instead was trying to get to sleep, which is why he was prescribed amitryptiline, and does not remember overdosing. Pleasant and cooperative. Socializing and playing cards with peers in the dayroom. A: Continue to monitor for safety R: Safety maintained.

## 2019-09-21 MED ORDER — TRAZODONE HCL 100 MG PO TABS: 100.0000 mg | ORAL_TABLET | Freq: Every evening | ORAL | 0 refills | Status: DC | PRN

## 2019-09-21 MED ORDER — BUSPIRONE HCL 10 MG PO TABS: 10.0000 mg | ORAL_TABLET | Freq: Three times a day (TID) | ORAL | 1 refills | Status: DC

## 2019-09-21 MED ORDER — TRAZODONE HCL 100 MG PO TABS: 100.0000 mg | ORAL_TABLET | Freq: Every evening | ORAL | 1 refills | Status: DC | PRN

## 2019-09-21 MED ORDER — BUSPIRONE HCL 10 MG PO TABS: 10.0000 mg | ORAL_TABLET | Freq: Three times a day (TID) | ORAL | 0 refills | Status: DC

## 2019-09-21 MED ORDER — HYDROXYZINE HCL 25 MG PO TABS: 25.0000 mg | ORAL_TABLET | Freq: Four times a day (QID) | ORAL | 0 refills | Status: DC | PRN

## 2019-09-21 MED ORDER — DULOXETINE HCL 60 MG PO CPEP: 60.0000 mg | ORAL_CAPSULE | Freq: Every day | ORAL | 1 refills | Status: DC

## 2019-09-21 MED ORDER — DULOXETINE HCL 60 MG PO CPEP: 60.0000 mg | ORAL_CAPSULE | Freq: Every day | ORAL | 0 refills | Status: DC

## 2019-09-21 MED ORDER — HYDROXYZINE HCL 25 MG PO TABS: 25.0000 mg | ORAL_TABLET | Freq: Four times a day (QID) | ORAL | 1 refills | Status: DC | PRN

## 2019-09-21 NOTE — Progress Notes (Signed)
  [facility] Adult Case Management Discharge Plan :  Will you be returning to the same living situation after discharge:  Yes,  pt is returning to the home with his father. At discharge, do you have transportation home?: Yes,  pt father will provide transportation. Do you have the ability to pay for your medications: Yes,  [employer] [employer].  Release of information consent forms completed and in the chart;  Patient's signature needed at discharge.  Patient to Follow up at: Follow-up Information    [name] Follow up.   Why: Please follow up with your psychiatrist at discharge. You report your appointment is in the morning on 1/15 or 1/17.  CSW has been unable to confirm.           Next level of care provider has access to [facility]:no  Safety Planning and Suicide Prevention discussed: Yes,  SPE completed with the patient's father.  Have you used any form of tobacco in the last 30 days? (Cigarettes, Smokeless Tobacco, Cigars, and/or Pipes): Yes  Has patient been referred to the Quitline?: Patient refused referral  Patient has been referred for addiction treatment: Pt. refused referral  Harden Mo, LCSW 09/21/2019, 9:54 AM

## 2019-09-21 NOTE — [facility] Suicide Risk Assessment (Signed)
[facility] INPATIENT:  Family/Significant Other Suicide Prevention Education  Suicide Prevention Education:  Education Completed; Mazen Marcin, father, [phone],  (name of family member/significant other) has been identified by the patient as the family member/significant other with whom the patient will be residing, and identified as the person(s) who will aid the patient in the event of a mental health crisis (suicidal ideations/suicide attempt).  With written consent from the patient, the family member/significant other has been provided the following suicide prevention education, prior to the and/or following the discharge of the patient.  The suicide prevention education provided includes the following:  Suicide risk factors  Suicide prevention and interventions  National Suicide Hotline telephone number  [facility] assessment telephone number  [facility] Emergency Assistance 911  [facility] and/or Residential Mobile Crisis Unit telephone number  Request made of family/significant other to:  Remove weapons (e.g., guns, rifles, knives), all items previously/currently identified as safety concern.    Remove drugs/medications (over-the-counter, prescriptions, illicit drugs), all items previously/currently identified as a safety concern.  The family member/significant other verbalizes understanding of the suicide prevention education information provided.  The family member/significant other agrees to remove the items of safety concern listed above.  Father reports that patient can return to his home. Father reports that he has no concerns for patient at this time and believes the patient to have improved. He reports that patient does not have access to any guns or weapons. He was unable to provide the name of the patient's psychiatrist.   Harden Mo 09/21/2019, 9:46 AM

## 2019-09-21 NOTE — Progress Notes (Signed)
Recreation Therapy Notes  Date: 09/21/2019  Time: 9:30 am  Location: Craft room  Behavioral response: Appropriate  Intervention Topic: Stress Management   Discussion/Intervention:  Group content on today was focused on stress. The group defined stress and way to cope with stress. Participants expressed how they know when they are stresses out. Individuals described the different ways they have to cope with stress. The group stated reasons why it is important to cope with stress. Patient explained what good stress is and some examples. The group participated in the intervention "Stress Management". Individuals were able to answer questions related to stress.  Clinical Observations/Feedback:  Patient came to group and defined stress as money and women. He explained that he deals with his stress by listening to music. Participant identified not eating as a negative symptom of stress.  Individual participated in the intervention during group and was social with peers and staff. Irelynn Schermerhorn LRT/CTRS         Alireza Pollack 09/21/2019 12:07 PM

## 2019-09-21 NOTE — Plan of Care (Signed)
Patient is appropriate with staff & peers.Denies SI,HI and AVH.Patient stated that he need to live for his child.Compliant with medications.Appetite and energy level good.Support and encouragement given.

## 2019-09-21 NOTE — [facility] Suicide Risk Assessment (Signed)
[facility] Discharge Suicide Risk Assessment   Principal Problem: Major depressive disorder, recurrent severe without psychotic features (HCC) Discharge Diagnoses: Principal Problem:   Major depressive disorder, recurrent severe without psychotic features (HCC) Active Problems:   Chronic pain syndrome   Overdose   Tricyclic overdose   Total Time spent with patient: 20 minutes  Musculoskeletal: Strength & Muscle Tone: within normal limits Gait & Station: normal Patient leans: N/A  Psychiatric Specialty Exam: Review of Systems  All other systems reviewed and are negative.   Blood pressure 121/86, pulse 97, temperature 98.1 F (36.7 C), temperature source Oral, resp. rate 18, height 5\' 9"  (1.753 m), weight 59.4 kg, SpO2 100 %.Body mass index is 19.35 kg/m.  General Appearance: Casual  Eye Contact::  Fair  Speech:  Normal Rate409  Volume:  Normal  Mood:  Euthymic  Affect:  Congruent  Thought Process:  Coherent and Descriptions of Associations: Intact  Orientation:  Full (Time, Place, and Person)  Thought Content:  Logical  Suicidal Thoughts:  No  Homicidal Thoughts:  No  Memory:  Immediate;   Fair Recent;   Fair Remote;   Fair  Judgement:  Intact  Insight:  Fair  Psychomotor Activity:  Normal  Concentration:  Good  Recall:  Good  Fund of Knowledge:Good  Language: Good  Akathisia:  Negative  Handed:  Right  AIMS (if indicated):     Assets:  Desire for Improvement Housing Resilience  Sleep:  Number of Hours: 6.45  Cognition: WNL  ADL's:  Intact   Mental Status Per Nursing Assessment::   On Admission:  NA(denies)  Demographic Factors:  Male and Caucasian  Loss Factors: Loss of significant relationship  Historical Factors: Impulsivity  Risk Reduction Factors:   Responsible for children under 53 years of age, Employed, Living with another person, especially a relative and Positive therapeutic relationship  Continued Clinical Symptoms:  Depression:   Comorbid  alcohol abuse/dependence Impulsivity  Cognitive Features That Contribute To Risk:  None    Suicide Risk:  Minimal: No identifiable suicidal ideation.  Patients presenting with no risk factors but with morbid ruminations; may be classified as minimal risk based on the severity of the depressive symptoms  Follow-up Information    [name] Follow up.   Why: Please follow up with your psychiatrist at discharge. You report your appointment is in the morning on 1/15 or 1/17.  CSW has been unable to confirm.           Plan Of Care/Follow-up recommendations:  Activity:  ad lib  2/17, MD 09/21/2019, 10:08 AM

## 2019-09-21 NOTE — BHH Counselor (Signed)
CSW met with the patient regarding his provider. Per pharmacy the pt's medications were prescribed by a [name], however, CSW has not been able to identify where Dr. Liner works. Calls to local communty mental health agencies have not been fruitful.   Patient reports that he has an appointment on Jan 15th or 17th "in the morning".  CSW provided with list of local community mental health agencies as a back up.   Michaela Stanfield, MSW, LCSW 09/21/2019 9:50 AM  

## 2019-09-21 NOTE — Plan of Care (Signed)
D: Pt. During assessments this morning denies SI/HI/AVH, verbalizes contract for safety on and off the unit. Pt is pleasant and cooperative, engagement is fair. Pt. has no Complaints.  Patient interaction appropriate. Pt. Denies pain. Pt. Eager to discharge.   A: Q x 15 minute observation checks in place for safety. Patient was and is provided with education throughout shift, as well as given information regarding pending discharge as information comes in.  Patient was and will be given/offered medications per orders. Patient was and is encouraged to attend groups, participate in unit activities and continue with plan of care, until pending discharge is completed. Pt. Chart and plans of care reviewed. Pt. Given support and encouragement.   R: Patient is complaint with medication and unit procedures. Pt. Observed eating good. No behavioral concerns to report.       Problem: Education: Goal: Emotional status will improve Outcome: Completed/Met Goal: Mental status will improve Outcome: Completed/Met   Problem: Coping: Goal: Ability to verbalize frustrations and anger appropriately will improve Outcome: Completed/Met Goal: Ability to demonstrate self-control will improve Outcome: Completed/Met   Problem: Safety: Goal: Periods of time without injury will increase Outcome: Completed/Met   Problem: Education: Goal: Knowledge of [facility] Education information/materials will improve Outcome: Completed/Met

## 2019-09-21 NOTE — Progress Notes (Signed)
D:Patient denies SI/HI/AVH, able to contract for safety at this time. Pt appears calm and cooperative, and no distress noted.   A: All Personal items in locker returned to pt. Upon discharge.   R:  Pt States he will comply with discharge planning put into place and take MEDS as prescribed. Pt escorted out of the building by this writer/staff.  

## 2019-09-21 NOTE — Discharge Summary (Signed)
Physician Discharge Summary Note  Patient:  Jose Zimmerman is an 34 y.o., male MRN:  [id number] DOB:  25-Jul-1986 Patient phone:  [phone] (home)  Patient address:   [address] [place] [address],  Total Time spent with patient: 30 minutes  Date of Admission:  09/18/2019 Date of Discharge: 09/21/19  Reason for Admission:  34 y.o.malewho presents by EMS for decreased level of responsiveness and suspected intentional overdose. According to EMS he was found by his family, slumped over and minimally responsive with vomit all around him. They said that he has no prior history of intentional overdose but he has been going through a bad time recently with an ongoing divorce and that he does have a history of anxiety and depression.  Principal Problem: Major depressive disorder, recurrent severe without psychotic features [facility]) Discharge Diagnoses: Principal Problem:   Major depressive disorder, recurrent severe without psychotic features (HCC) Active Problems:   Chronic pain syndrome   Overdose   Tricyclic overdose   Past Psychiatric History: PTSD, depression, and anxiety  Past Medical History:  Past Medical History:  Diagnosis Date  . Anxiety   . Depression   . GERD (gastroesophageal reflux disease)   . Vertigo     Past Surgical History:  Procedure Laterality Date  . ULNAR NERVE TRANSPOSITION Left 2009   Family History:  Family History  Problem Relation Age of Onset  . Osteoporosis Mother   . Diabetes Father   . Hypertension Father   . Hyperlipidemia Father   . Crohn's disease Other    Family Psychiatric  History: None reported Social History:  Social History   Substance and Sexual Activity  Alcohol Use Not Currently   Comment: quit June 2018     Social History   Substance and Sexual Activity  Drug Use No    Social History   Socioeconomic History  . Marital status: Married    Spouse name: Not on file  . Number of children: Not on file  .  Years of education: Not on file  . Highest education level: Not on file  Occupational History  . Not on file  Tobacco Use  . Smoking status: Current Every Day Smoker    Packs/day: 2.00    Years: 10.00    Pack years: 20.00    Types: Cigarettes  . Smokeless tobacco: Former Engineer, water and Sexual Activity  . Alcohol use: Not Currently    Comment: quit June 2018  . Drug use: No  . Sexual activity: Yes  Other Topics Concern  . Not on file  Social History Narrative  . Not on file   Social Determinants of Health   Financial Resource Strain:   . Difficulty of Paying Living Expenses: Not on file  Food Insecurity:   . Worried About Programme researcher, broadcasting/film/video in the Last Year: Not on file  . Ran Out of Food in the Last Year: Not on file  Transportation Needs:   . Lack of Transportation (Medical): Not on file  . Lack of Transportation (Non-Medical): Not on file  Physical Activity:   . Days of Exercise per Week: Not on file  . Minutes of Exercise per Session: Not on file  Stress:   . Feeling of Stress : Not on file  Social Connections:   . Frequency of Communication with Friends and Family: Not on file  . Frequency of Social Gatherings with Friends and Family: Not on file  . Attends Religious Services: Not on  file  . Active Member of Clubs or Organizations: Not on file  . Attends Archivist Meetings: Not on file  . Marital Status: Not on file    Hospital Course:  Patient remained on the [facility] unit for 2 days. The patient stabilized on medication and therapy. Patient was discharged on BuSpar 10 mg p.o. 3 times daily, Cymbalta 60 mg p.o. daily, Vistaril 25 mg p.o. every 6 hours as needed, and trazodone 100 mg p.o. nightly as needed. Patient has shown improvement with improved mood, affect, sleep, appetite, and interaction. Patient has attended group and participated. Patient has been seen in the day room interacting with peers and staff appropriately. Patient denies any SI/HI/AVH  and contracts for safety. Patient agrees to follow up at [facility]. Patient is provided with prescriptions for their medications upon discharge.  It is been confirmed the patient will be discharging home with his father and mother.  Father has been contacted and verified the patient is welcome to return to their home.  Physical Findings: AIMS:  , ,  ,  ,    CIWA:    COWS:     Musculoskeletal: Strength & Muscle Tone: within normal limits Gait & Station: normal Patient leans: N/A  Psychiatric Specialty Exam: Physical Exam  Nursing note and vitals reviewed. Constitutional: He is oriented to person, place, and time. He appears well-developed and well-nourished.  Cardiovascular: Normal rate.  Respiratory: Effort normal.  Musculoskeletal:        General: Normal range of motion.  Neurological: He is alert and oriented to person, place, and time.  Skin: Skin is warm.    Review of Systems  Constitutional: Negative.   HENT: Negative.   Eyes: Negative.   Respiratory: Negative.   Cardiovascular: Negative.   Gastrointestinal: Negative.   Genitourinary: Negative.   Musculoskeletal: Negative.   Skin: Negative.   Neurological: Negative.   Psychiatric/Behavioral: Negative.     Blood pressure 121/86, pulse 97, temperature 98.1 F (36.7 C), temperature source Oral, resp. rate 18, height 5\' 9"  (1.753 m), weight 59.4 kg, SpO2 100 %.Body mass index is 19.35 kg/m.  General Appearance: Casual  Eye Contact:  Good  Speech:  Clear and Coherent and Normal Rate  Volume:  Normal  Mood:  Euthymic  Affect:  Congruent  Thought Process:  Coherent and Descriptions of Associations: Intact  Orientation:  Full (Time, Place, and Person)  Thought Content:  WDL  Suicidal Thoughts:  No  Homicidal Thoughts:  No  Memory:  Immediate;   Good Recent;   Fair Remote;   Good  Judgement:  Fair  Insight:  Fair  Psychomotor Activity:  Normal  Concentration:  Concentration: Good  Recall:  Good  Fund of  Knowledge:  Good  Language:  Good  Akathisia:  No  Handed:  Right  AIMS (if indicated):     Assets:  Communication Skills Desire for Improvement Financial Resources/Insurance Housing Resilience Social Support  ADL's:  Intact  Cognition:  WNL  Sleep:  Number of Hours: 6.45     Have you used any form of tobacco in the last 30 days? (Cigarettes, Smokeless Tobacco, Cigars, and/or Pipes): Yes  Has this patient used any form of tobacco in the last 30 days? (Cigarettes, Smokeless Tobacco, Cigars, and/or Pipes) Yes, Yes, A prescription for an FDA-approved tobacco cessation medication was offered at discharge and the patient refused  Blood Alcohol level:  Lab Results  Component Value Date   [facility] <10 [id number]/78/6767    Metabolic Disorder  Labs:  No results found for: HGBA1C, MPG No results found for: PROLACTIN No results found for: CHOL, TRIG, HDL, CHOLHDL, VLDL, LDLCALC  See Psychiatric Specialty Exam and Suicide Risk Assessment completed by Attending Physician prior to discharge.  Discharge destination:  Home  Is patient on multiple antipsychotic therapies at discharge:  No   Has Patient had three or more failed trials of antipsychotic monotherapy by history:  No  Recommended Plan for Multiple Antipsychotic Therapies: NA  Discharge Instructions    Diet - low sodium heart healthy   Complete by: As directed    Increase activity slowly   Complete by: As directed      Allergies as of 09/21/2019      Reactions   Gabapentin Nausea And Vomiting   Drowsiness, confusion   Keppra [levetiracetam] Nausea And Vomiting   Drowsiness, confusion      Medication List    STOP taking these medications   amitriptyline 50 MG tablet Commonly known as: ELAVIL     TAKE these medications     Indication  busPIRone 10 MG tablet Commonly known as: BUSPAR Take 1 tablet (10 mg total) by mouth 3 (three) times daily. What changed: when to take this  Indication: Anxiety Disorder, Major Depressive  Disorder   DULoxetine 60 MG capsule Commonly known as: CYMBALTA Take 1 capsule (60 mg total) by mouth daily. Start taking on: September 22, 2019  Indication: Major Depressive Disorder, Musculoskeletal Pain   hydrOXYzine 25 MG tablet Commonly known as: ATARAX/VISTARIL Take 1 tablet (25 mg total) by mouth every 6 (six) hours as needed for anxiety.  Indication: Feeling Anxious   traZODone 100 MG tablet Commonly known as: DESYREL Take 1 tablet (100 mg total) by mouth at bedtime as needed for sleep.  Indication: Trouble Sleeping      Follow-up Information    [name] Follow up.   Why: Please follow up with your psychiatrist at discharge. You report your appointment is in the morning on 1/15 or 1/17.  CSW has been unable to confirm.           Follow-up recommendations:  Continue activity as tolerated. Continue diet as recommended by your PCP. Ensure to keep all appointments with outpatient providers.  Comments:  Patient is instructed prior to discharge to: Take all medications as prescribed by his/her mental healthcare provider. Report any adverse effects and or reactions from the medicines to his/her outpatient provider promptly. Patient has been instructed & cautioned: To not engage in alcohol and or illegal drug use while on prescription medicines. In the event of worsening symptoms, patient is instructed to call the crisis hotline, 911 and or go to the nearest ED for appropriate evaluation and treatment of symptoms. To follow-up with his/her primary care provider for your other medical issues, concerns and or health care needs.    Signed: Gerlene Burdock Montrel Donahoe, FNP 09/21/2019, 10:11 AM

## 2019-12-22 ENCOUNTER — Ambulatory Visit
Admission: EM | Admit: 2019-12-22 | Discharge: 2019-12-22 | Disposition: A | Discharge status: Home / Self Care | Payer: Medicaid Other | Attending: Internal Medicine | Admitting: Internal Medicine

## 2019-12-22 ENCOUNTER — Ambulatory Visit: Payer: Medicaid Other

## 2019-12-22 ENCOUNTER — Encounter: Payer: Self-pay | Admitting: Emergency Medicine

## 2019-12-22 ENCOUNTER — Other Ambulatory Visit: Payer: Self-pay

## 2019-12-22 DIAGNOSIS — M545 Low back pain, unspecified: Secondary | ICD-10-CM

## 2019-12-22 DIAGNOSIS — W228XXA Striking against or struck by other objects, initial encounter: Secondary | ICD-10-CM

## 2019-12-22 DIAGNOSIS — S5002XA Contusion of left elbow, initial encounter: Secondary | ICD-10-CM | POA: Insufficient documentation

## 2019-12-22 MED ORDER — HYDROCODONE-ACETAMINOPHEN 5-325 MG PO TABS: 2.0000 | ORAL_TABLET | Freq: Four times a day (QID) | ORAL | 0 refills | Status: AC | PRN

## 2019-12-22 MED ORDER — IBUPROFEN 600 MG PO TABS: 600.0000 mg | ORAL_TABLET | Freq: Four times a day (QID) | ORAL | 0 refills | Status: DC | PRN

## 2019-12-22 NOTE — ED Triage Notes (Signed)
Patient in today c/o left elbow pain that radiates down to his fingers and low back pain x 1 day. Patient states he was doing some clean up at his home yesterday and a piece of metal hit his left elbow. Patient states he tried to catch a tool box that was falling while he was trying to put it on a trailer and his back has been hurting from that. Patient has tried OTC Tylenol, Aleve and ice without relief.

## 2019-12-22 NOTE — ED Provider Notes (Signed)
[facility]    CSN: [id number] Arrival date & time: 12/22/19  1720      History   Chief Complaint Chief Complaint  Patient presents with  . Elbow Pain    left  . Back Pain    HPI Jose Zimmerman is a 34 y.o. male comes to urgent care with complaints of left elbow pain and right-sided low back pain of 1 day duration.  Patient was doing some cleaning up at his home yesterday when one of his metal tools bounced off the ground and hit his left elbow.  Pain in the left elbow is sharp, severe and is associated with some numbness in the ring and little finger.  Pain radiates into the fingers.  He has tried Aleve, Tylenol and icing it without any improvement.  Patient also complains of severe back pain.   Patient's pain started abruptly when he tried to catch a heavy falling metal.  He denies any numbness or tingling.  Pain is worsened by movement.  No known relieving factors.  No radiation of pain into the legs.  No urinary symptoms.  HPI  Past Medical History:  Diagnosis Date  . Anxiety   . Depression   . GERD (gastroesophageal reflux disease)   . Vertigo     Patient Active Problem List   Diagnosis Date Noted  . Major depressive disorder, recurrent severe without psychotic features (HCC) 09/18/2019  . Major depressive disorder, single episode, severe without psychosis (HCC) 09/17/2019  . Overdose 09/17/2019  . Tricyclic overdose   . Enteritis 09/15/2015  . Chronic pain syndrome 03/15/2015  . GERD (gastroesophageal reflux disease) 03/15/2015  . Restless legs syndrome 03/15/2015    Past Surgical History:  Procedure Laterality Date  . ULNAR NERVE TRANSPOSITION Left 2009       Home Medications    Prior to Admission medications   Medication Sig Start Date End Date Taking? Authorizing Provider  busPIRone (BUSPAR) 10 MG tablet Take 1 tablet (10 mg total) by mouth 3 (three) times daily. 09/21/19  Yes Money, Gerlene Burdock, FNP  clonazePAM (KLONOPIN) 0.5 MG tablet Take  0.5 mg by mouth 3 (three) times daily as needed for anxiety.   Yes [provider]  DULoxetine (CYMBALTA) 60 MG capsule Take 1 capsule (60 mg total) by mouth daily. 09/22/19  Yes Money, Gerlene Burdock, FNP  hydrOXYzine (ATARAX/VISTARIL) 25 MG tablet Take 1 tablet (25 mg total) by mouth every 6 (six) hours as needed for anxiety. 09/21/19  Yes Money, Gerlene Burdock, FNP  traZODone (DESYREL) 100 MG tablet Take 1 tablet (100 mg total) by mouth at bedtime as needed for sleep. 09/21/19  Yes Money, Gerlene Burdock, FNP  HYDROcodone-acetaminophen (NORCO/VICODIN) 5-325 MG tablet Take 2 tablets by mouth every 6 (six) hours as needed for up to 3 days. 12/22/19 12/25/19  Merrilee Jansky, MD  ibuprofen (ADVIL) 600 MG tablet Take 1 tablet (600 mg total) by mouth every 6 (six) hours as needed. 12/22/19   Yuto Cajuste, Britta Mccreedy, MD    Family History Family History  Problem Relation Age of Onset  . Osteoporosis Mother   . Diabetes Father   . Hypertension Father   . Hyperlipidemia Father   . Crohn's disease Other     Social History Social History   Tobacco Use  . Smoking status: Current Every Day Smoker    Packs/day: 2.00    Years: 10.00    Pack years: 20.00    Types: Cigarettes  . Smokeless tobacco: Former  User  . Tobacco comment: 12/22/19 2 cig/day  Substance Use Topics  . Alcohol use: Not Currently    Comment: quit June 2018  . Drug use: No     Allergies   Gabapentin and Keppra [levetiracetam]   Review of Systems Review of Systems  Constitutional: Positive for activity change.  HENT: Negative.   Gastrointestinal: Negative.  Negative for abdominal pain.  Genitourinary: Negative.   Musculoskeletal: Positive for arthralgias, back pain and joint swelling. Negative for myalgias, neck pain and neck stiffness.  Neurological: Negative for dizziness, light-headedness, numbness and headaches.  Psychiatric/Behavioral: Negative for confusion and decreased concentration.     Physical Exam Triage Vital Signs ED  Triage Vitals  Enc Vitals Group     BP 12/22/19 1732 129/90     Pulse Rate 12/22/19 1732 100     Resp 12/22/19 1732 18     Temp 12/22/19 1732 98.2 F (36.8 C)     Temp Source 12/22/19 1732 Oral     SpO2 12/22/19 1732 98 %     Weight 12/22/19 1734 150 lb (68 kg)     Height 12/22/19 1734 5\' 9"  (1.753 m)     Head Circumference --      Peak Flow --      Pain Score 12/22/19 1733 7     Pain Loc --      Pain Edu? --      Excl. in [place]? --    No data found.  Updated Vital Signs BP 129/90 (BP Location: Left Arm)   Pulse 100   Temp 98.2 F (36.8 C) (Oral)   Resp 18   Ht 5\' 9"  (1.753 m)   Wt 68 kg   SpO2 98%   BMI 22.15 kg/m   Visual Acuity Right Eye Distance:   Left Eye Distance:   Bilateral Distance:    Right Eye Near:   Left Eye Near:    Bilateral Near:     Physical Exam Vitals and nursing note reviewed.  Constitutional:      General: He is in acute distress.     Appearance: He is ill-appearing.  Cardiovascular:     Rate and Rhythm: Normal rate and regular rhythm.     Pulses: Normal pulses.     Heart sounds: Normal heart sounds.  Pulmonary:     Effort: Pulmonary effort is normal. No respiratory distress.     Breath sounds: Normal breath sounds. No wheezing or rhonchi.  Abdominal:     General: Bowel sounds are normal.     Palpations: Abdomen is soft.  Musculoskeletal:     Comments: Tenderness over the lumbar spine.  Range of motion is limited by pain.  Power in the lower extremities are 5 out of 5.  Deep tendon reflexes are 2+.  No deficits.  Skin:    General: Skin is warm.     Comments: Tenderness on palpation of the left elbow.  Limited range of motion.  Swelling on the medial aspect of the left elbow.  No bruising noted.  Patient is able to clench his fist in the left hand.  Neurological:     General: No focal deficit present.     Mental Status: He is alert and oriented to person, place, and time.      UC Treatments / Results  Labs (all labs ordered are  listed, but only abnormal results are displayed) Labs Reviewed - No data to display  EKG   Radiology DG Lumbar Spine Complete  Result Date: 12/22/2019 CLINICAL DATA:  Low back pain after trying to catch heavy falling metal. EXAM: LUMBAR SPINE - COMPLETE 4+ VIEW COMPARISON:  10/15/2017 FINDINGS: Bones appear demineralized for patient age. No evidence for fracture or subluxation. Intervertebral disc spaces are preserved. IMPRESSION: Bony demineralization for stated patient age. No acute bony abnormality. Electronically Signed   By: Misty Stanley M.D.   On: 12/22/2019 18:16   DG Elbow Complete Left  Result Date: 12/22/2019 CLINICAL DATA:  Left elbow pain that radiates to fingers. EXAM: LEFT ELBOW - COMPLETE 3+ VIEW COMPARISON:  None. FINDINGS: There is no evidence of fracture, dislocation, or joint effusion. There is no evidence of arthropathy or other focal bone abnormality. Soft tissues are unremarkable. IMPRESSION: Negative. Electronically Signed   By: Misty Stanley M.D.   On: 12/22/2019 18:14    Procedures Procedures (including critical care time)  Medications Ordered in UC Medications - No data to display  Initial Impression / Assessment and Plan / UC Course  I have reviewed the triage vital signs and the nursing notes.  Pertinent labs & imaging results that were available during my care of the patient were reviewed by me and considered in my medical decision making (see chart for details).     1.  Left elbow contusion: X-ray of the left elbow is negative for any fracture Hydrocodone as needed for pain. Gentle range of motion exercises Icing of the left elbow Return precautions given  2.  Low back pain: X-ray of the lumbar spine Hydrocodone as needed for pain Return precautions given Gentle range of motion exercises Icing of the lower back Final Clinical Impressions(s) / UC Diagnoses   Final diagnoses:  Contusion of left elbow, initial encounter  Acute right-sided low  back pain without sciatica   Discharge Instructions   None    ED Prescriptions    Medication Sig Dispense Auth. Provider   ibuprofen (ADVIL) 600 MG tablet Take 1 tablet (600 mg total) by mouth every 6 (six) hours as needed. 30 tablet Logon Uttech, Myrene Galas, MD   HYDROcodone-acetaminophen (NORCO/VICODIN) 5-325 MG tablet Take 2 tablets by mouth every 6 (six) hours as needed for up to 3 days. 10 tablet Katera Rybka, Myrene Galas, MD     I have reviewed the PDMP during this encounter.   Chase Picket, MD 12/22/19 Bosie Helper

## 2020-01-10 ENCOUNTER — Emergency Department
Admission: EM | Admit: 2020-01-10 | Discharge: 2020-01-10 | Disposition: A | Discharge status: Home / Self Care | Payer: Medicaid Other | Attending: Emergency Medicine | Admitting: Emergency Medicine

## 2020-01-10 ENCOUNTER — Emergency Department: Payer: Medicaid Other

## 2020-01-10 ENCOUNTER — Other Ambulatory Visit: Payer: Self-pay

## 2020-01-10 ENCOUNTER — Encounter: Payer: Self-pay | Admitting: *Deleted

## 2020-01-10 DIAGNOSIS — Z5321 Procedure and treatment not carried out due to patient leaving prior to being seen by health care provider: Secondary | ICD-10-CM | POA: Diagnosis not present

## 2020-01-10 DIAGNOSIS — F410 Panic disorder [episodic paroxysmal anxiety] without agoraphobia: Secondary | ICD-10-CM | POA: Insufficient documentation

## 2020-01-10 LAB — BASIC METABOLIC PANEL
Anion gap: 11 (ref 5–15)
BUN: 16 mg/dL (ref 6–20)
CO2: 25 mmol/L (ref 22–32)
Calcium: 9.6 mg/dL (ref 8.9–10.3)
Chloride: 101 mmol/L (ref 98–111)
Creatinine, Ser: 0.9 mg/dL (ref 0.61–1.24)
GFR calc Af Amer: >60 mL/min (ref >60)
GFR calc non Af Amer: >60 mL/min (ref >60)
Glucose, Bld: 102 mg/dL — ABNORMAL HIGH (ref 70–99)
Potassium: 3.9 mmol/L (ref 3.5–5.1)
Sodium: 137 mmol/L (ref 135–145)

## 2020-01-10 LAB — CBC
HCT: 43.5 % (ref 39.0–52.0)
Hemoglobin: 15.3 g/dL (ref 13.0–17.0)
MCH: 31.6 pg (ref 26.0–34.0)
MCHC: 35.2 g/dL (ref 30.0–36.0)
MCV: 89.9 fL (ref 80.0–100.0)
Platelets: 241 10*3/uL (ref 150–400)
RBC: 4.84 MIL/uL (ref 4.22–5.81)
RDW: 12.8 % (ref 11.5–15.5)
WBC: 15.1 10*3/uL — ABNORMAL HIGH (ref 4.0–10.5)
nRBC: 0 % (ref 0.0–0.2)

## 2020-01-10 LAB — TROPONIN I (HIGH SENSITIVITY): Troponin I (High Sensitivity): 4 ng/L (ref <18)

## 2020-01-10 MED ORDER — SODIUM CHLORIDE 0.9% FLUSH: 3.0000 mL | Freq: Once | INTRAVENOUS | Status: DC

## 2020-01-10 NOTE — ED Notes (Signed)
Pt informed first nurse he is leaving.  States feels much better, denies pain or SOB, ambulatory with steady gait.
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
## 2020-09-19 ENCOUNTER — Other Ambulatory Visit: Payer: Self-pay

## 2020-09-19 ENCOUNTER — Emergency Department
Admission: EM | Admit: 2020-09-19 | Discharge: 2020-09-19 | Disposition: A | Discharge status: Home / Self Care | Payer: Medicaid Other | Attending: Emergency Medicine | Admitting: Emergency Medicine

## 2020-09-19 DIAGNOSIS — R059 Cough, unspecified: Secondary | ICD-10-CM | POA: Diagnosis present

## 2020-09-19 DIAGNOSIS — F17210 Nicotine dependence, cigarettes, uncomplicated: Secondary | ICD-10-CM | POA: Insufficient documentation

## 2020-09-19 DIAGNOSIS — U071 COVID-19: Secondary | ICD-10-CM | POA: Diagnosis not present

## 2020-09-19 NOTE — ED Notes (Signed)
Pt assessed by provider prior to discharge.Pt signed paper copy of d/c, follow up instructions indicating understanding of information provided

## 2020-09-19 NOTE — ED Triage Notes (Signed)
Pt arrives via pov, ambulatory to triage. C/o cough, fever. Pt reports home covid test was positive but needs a note for work. NAD noted

## 2020-09-19 NOTE — ED Provider Notes (Signed)
[facility] Emergency Department Provider Note  ____________________________________________  Time seen: Approximately 1:48 PM  I have reviewed the triage vital signs and the nursing notes.   HISTORY  Chief Complaint Fever and Cough    HPI Jose Zimmerman is a 35 y.o. male presents to emergency department for a work note for his COVID-19.  Patient states that he took a home test for Covid this morning and it was positive.  He has had a fever, body aches, nonproductive cough for 2 days.  He only presents today for a work note.  He denies any shortness of breath, chest pain, vomiting, abdominal pain, diarrhea.   Past Medical History:  Diagnosis Date  . Anxiety   . Depression   . GERD (gastroesophageal reflux disease)   . Vertigo     Patient Active Problem List   Diagnosis Date Noted  . Major depressive disorder, recurrent severe without psychotic features (HCC) 09/18/2019  . Major depressive disorder, single episode, severe without psychosis (HCC) 09/17/2019  . Overdose 09/17/2019  . Tricyclic overdose   . Enteritis 09/15/2015  . Chronic pain syndrome 03/15/2015  . GERD (gastroesophageal reflux disease) 03/15/2015  . Restless legs syndrome 03/15/2015    Past Surgical History:  Procedure Laterality Date  . ULNAR NERVE TRANSPOSITION Left 2009    Prior to Admission medications   Medication Sig Start Date End Date Taking? Authorizing Provider  busPIRone (BUSPAR) 10 MG tablet Take 1 tablet (10 mg total) by mouth 3 (three) times daily. 09/21/19   Money, Gerlene Burdock, FNP  clonazePAM (KLONOPIN) 0.5 MG tablet Take 0.5 mg by mouth 3 (three) times daily as needed for anxiety.    [provider]  DULoxetine (CYMBALTA) 60 MG capsule Take 1 capsule (60 mg total) by mouth daily. 09/22/19   Money, Gerlene Burdock, FNP  hydrOXYzine (ATARAX/VISTARIL) 25 MG tablet Take 1 tablet (25 mg total) by mouth every 6 (six) hours as needed for anxiety. 09/21/19   Money, Gerlene Burdock,  FNP  ibuprofen (ADVIL) 600 MG tablet Take 1 tablet (600 mg total) by mouth every 6 (six) hours as needed. 12/22/19   Merrilee Jansky, MD  traZODone (DESYREL) 100 MG tablet Take 1 tablet (100 mg total) by mouth at bedtime as needed for sleep. 09/21/19   Money, Gerlene Burdock, FNP    Allergies Gabapentin and Keppra [levetiracetam]  Family History  Problem Relation Age of Onset  . Osteoporosis Mother   . Diabetes Father   . Hypertension Father   . Hyperlipidemia Father   . Crohn's disease Other     Social History Social History   Tobacco Use  . Smoking status: Current Every Day Smoker    Packs/day: 2.00    Years: 10.00    Pack years: 20.00    Types: Cigarettes  . Smokeless tobacco: Former Neurosurgeon  . Tobacco comment: 12/22/19 2 cig/day  Vaping Use  . Vaping Use: Every day  . Substances: Flavoring  Substance Use Topics  . Alcohol use: Not Currently    Comment: quit June 2018  . Drug use: No     Review of Systems  Constitutional: Positive for fever. Eyes: No visual changes. No discharge. ENT: Negative for congestion and rhinorrhea. Cardiovascular: No chest pain. Respiratory: Positive for cough. No SOB. Gastrointestinal: No abdominal pain.  No nausea, no vomiting.  No diarrhea.  No constipation. Musculoskeletal: Positive for body aches. Skin: Negative for rash, abrasions, lacerations, ecchymosis. Neurological: Positive for headache.   ____________________________________________   PHYSICAL  EXAM:  VITAL SIGNS: ED Triage Vitals  Enc Vitals Group     BP 09/19/20 1333 132/86     Pulse Rate 09/19/20 1333 97     Resp 09/19/20 1333 18     Temp 09/19/20 1333 98.3 F (36.8 C)     Temp Source 09/19/20 1333 Oral     SpO2 09/19/20 1333 98 %     Weight 09/19/20 1334 150 lb (68 kg)     Height 09/19/20 1334 5\' 9"  (1.753 m)     Head Circumference --      Peak Flow --      Pain Score 09/19/20 1334 6     Pain Loc --      Pain Edu? --      Excl. in [place]? --      Constitutional:  Alert and oriented. Well appearing and in no acute distress. Eyes: Conjunctivae are normal. PERRL. EOMI. No discharge. Head: Atraumatic. ENT: No frontal and maxillary sinus tenderness.      Ears: Tympanic membranes pearly gray with good landmarks. No discharge.      Nose: No congestion/rhinnorhea.      Mouth/Throat: Mucous membranes are moist. Oropharynx non-erythematous. Tonsils not enlarged. No exudates. Uvula midline. Neck: No stridor.   Hematological/Lymphatic/Immunilogical: No cervical lymphadenopathy. Cardiovascular: Normal rate, regular rhythm.  Good peripheral circulation. Respiratory: Normal respiratory effort without tachypnea or retractions. Lungs CTAB. Good air entry to the bases with no decreased or absent breath sounds. Gastrointestinal: Bowel sounds 4 quadrants. Soft and nontender to palpation. No guarding or rigidity. No palpable masses. No distention. Musculoskeletal: Full range of motion to all extremities. No gross deformities appreciated. Neurologic:  Normal speech and language. No gross focal neurologic deficits are appreciated.  Skin:  Skin is warm, dry and intact. No rash noted. Psychiatric: Mood and affect are normal. Speech and behavior are normal. Patient exhibits appropriate insight and judgement.   ____________________________________________   LABS (all labs ordered are listed, but only abnormal results are displayed)  Labs Reviewed - No data to display ____________________________________________  EKG   ____________________________________________  RADIOLOGY   No results found.  ____________________________________________    PROCEDURES  Procedure(s) performed:    Procedures    Medications - No data to display   ____________________________________________   INITIAL IMPRESSION / ASSESSMENT AND PLAN / ED COURSE  Pertinent labs & imaging results that were available during my care of the patient were reviewed by me and considered in  my medical decision making (see chart for details).  Review of the  CSRS was performed in accordance of the [place] prior to dispensing any controlled drugs.     Patient's diagnosis is consistent with Covid 19. Vital signs and exam are reassuring.  Patient already had a positive home Covid test and do not feel he needs another.  Work note was provided. Patient appears well and is staying well hydrated. Patient should alternate tylenol and ibuprofen for fever. Patient feels comfortable going home.  Patient is to follow up with PCP as needed or otherwise directed. Patient is given ED precautions to return to the ED for any worsening or new symptoms.  QUANTAE MARTEL was evaluated in Emergency Department on 09/19/2020 for the symptoms described in the history of present illness. He was evaluated in the context of the global COVID-19 pandemic, which necessitated consideration that the patient might be at risk for infection with the SARS-CoV-2 virus that causes COVID-19. Institutional protocols and algorithms that pertain to the evaluation of patients  at risk for COVID-19 are in a state of rapid change based on information released by regulatory bodies including the CDC and federal and state organizations. These policies and algorithms were followed during the patient's care in the ED.   ____________________________________________  FINAL CLINICAL IMPRESSION(S) / ED DIAGNOSES  Final diagnoses:  COVID-19      NEW MEDICATIONS STARTED DURING THIS VISIT:  ED Discharge Orders    None          This chart was dictated using voice recognition software/Dragon. Despite best efforts to proofread, errors can occur which can change the meaning. Any change was purely unintentional.    Enid Derry, PA-C 09/19/20 1401    Shaune Pollack, MD 09/22/20 [phone]

## 2021-09-11 ENCOUNTER — Encounter: Payer: Self-pay | Admitting: Emergency Medicine

## 2021-09-11 ENCOUNTER — Ambulatory Visit
Admission: EM | Admit: 2021-09-11 | Discharge: 2021-09-11 | Disposition: A | Discharge status: Home / Self Care | Payer: Medicaid Other | Attending: Medical Oncology | Admitting: Medical Oncology

## 2021-09-11 DIAGNOSIS — R051 Acute cough: Secondary | ICD-10-CM | POA: Diagnosis not present

## 2021-09-11 DIAGNOSIS — R0981 Nasal congestion: Secondary | ICD-10-CM

## 2021-09-11 DIAGNOSIS — R509 Fever, unspecified: Secondary | ICD-10-CM | POA: Diagnosis not present

## 2021-09-11 DIAGNOSIS — R0602 Shortness of breath: Secondary | ICD-10-CM

## 2021-09-11 LAB — POCT INFLUENZA A/B
Influenza A, POC: NEGATIVE
Influenza B, POC: NEGATIVE

## 2021-09-11 MED ORDER — BENZONATATE 100 MG PO CAPS: 100.0000 mg | ORAL_CAPSULE | Freq: Three times a day (TID) | ORAL | 0 refills | Status: DC

## 2021-09-11 MED ORDER — FLUTICASONE PROPIONATE 50 MCG/ACT NA SUSP: 2.0000 | Freq: Every day | NASAL | 0 refills | Status: AC

## 2021-09-11 NOTE — ED Triage Notes (Signed)
Patient states that he woke up last night with difficulty breathing, congestion, cough.  Patient has taken Tylenol.  Patient is not vaccinated for COVID.

## 2021-09-11 NOTE — ED Provider Notes (Signed)
Jose Zimmerman    CSN: [id number] Arrival date & time: 09/11/21  0347      History   Chief Complaint Chief Complaint  Patient presents with   Cough    HPI Jose Zimmerman is a 35 y.o. male.   HPI  Cough: Patient reports that he has had a mostly dry cough, nasal congestion and some SOB. Symptoms started last night. SOB occurs with coughing fits. He has taken tylenol for symptoms with some relief. No hemoptysis, chest pain, vomiting. He denies recent heavy ETOH use or risk for aspiration.    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 Authorizing Provider  busPIRone (BUSPAR) 10 MG tablet Take 1 tablet (10 mg total) by mouth 3 (three) times daily. 09/21/19  Yes Money, Gerlene Burdock, FNP  ibuprofen (ADVIL) 600 MG tablet Take 1 tablet (600 mg total) by mouth every 6 (six) hours as needed. 12/22/19  Yes Lamptey, Britta Mccreedy, MD  clonazePAM (KLONOPIN) 0.5 MG tablet Take 0.5 mg by mouth 3 (three) times daily as needed for anxiety.    [provider]  DULoxetine (CYMBALTA) 60 MG capsule Take 1 capsule (60 mg total) by mouth daily. 09/22/19   Money, Gerlene Burdock, FNP  hydrOXYzine (ATARAX/VISTARIL) 25 MG tablet Take 1 tablet (25 mg total) by mouth every 6 (six) hours as needed for anxiety.  09/21/19   Money, Gerlene Burdock, FNP  traZODone (DESYREL) 100 MG tablet Take 1 tablet (100 mg total) by mouth at bedtime as needed for sleep. 09/21/19   Money, Gerlene Burdock, FNP    Family History Family History  Problem Relation Age of Onset   Osteoporosis Mother    Diabetes Father    Hypertension Father    Hyperlipidemia Father    Crohn's disease Other     Social History Social History   Tobacco Use   Smoking status: Every Day    Packs/day: 2.00    Years: 10.00    Pack years: 20.00    Types: Cigarettes   Smokeless tobacco: Former   Tobacco comments:    12/22/19 2 cig/day  Vaping Use   Vaping Use: Every day   Substances: Flavoring  Substance Use Topics   Alcohol use: Not Currently    Comment: quit June 2018   Drug use: No     Allergies   Gabapentin and Keppra [levetiracetam]   Review of Systems Review of Systems  As stated above in HPI Physical Exam Triage Vital Signs ED Triage Vitals [09/11/21 1011]  Enc Vitals Group     BP 129/83     Pulse Rate 97     Resp 18     Temp 99.4 F (37.4 C)     Temp Source Oral     SpO2  97 %     Weight 170 lb (77.1 kg)     Height 5\' 9"  (1.753 m)     Head Circumference      Peak Flow      Pain Score 7     Pain Loc      Pain Edu?      Excl. in [place]?    No data found.  Updated Vital Signs BP 129/83 (BP Location: Right Arm)    Pulse 97    Temp 99.4 F (37.4 C) (Oral)    Resp 18    Ht 5\' 9"  (1.753 m)    Wt 170 lb (77.1 kg)    SpO2 97%    BMI 25.10 kg/m   Physical Exam Vitals and nursing note reviewed.  Constitutional:      General: He is not in acute distress.    Appearance: Normal appearance. He is not ill-appearing, toxic-appearing or diaphoretic.  HENT:     Head: Normocephalic and atraumatic.     Right Ear: Tympanic membrane normal.     Left Ear: Tympanic membrane normal.     Nose: Congestion and rhinorrhea present.     Mouth/Throat:     Mouth: Mucous membranes are moist.     Pharynx: No oropharyngeal exudate or posterior  oropharyngeal erythema.  Eyes:     Extraocular Movements: Extraocular movements intact.     Pupils: Pupils are equal, round, and reactive to light.  Cardiovascular:     Rate and Rhythm: Normal rate and regular rhythm.     Heart sounds: Normal heart sounds.  Pulmonary:     Effort: Pulmonary effort is normal.     Breath sounds: Normal breath sounds.  Musculoskeletal:     Cervical back: Normal range of motion and neck supple.  Lymphadenopathy:     Cervical: No cervical adenopathy.  Skin:    General: Skin is warm.  Neurological:     Mental Status: He is alert and oriented to person, place, and time.     UC Treatments / Results  Labs (all labs ordered are listed, but only abnormal results are displayed) Labs Reviewed - No data to display  EKG   Radiology No results found.  Procedures Procedures (including critical care time)  Medications Ordered in UC Medications - No data to display  Initial Impression / Assessment and Plan / UC Course  I have reviewed the triage vital signs and the nursing notes.  Pertinent labs & imaging results that were available during my care of the patient were reviewed by me and considered in my medical decision making (see chart for details).     New. Rapid influenza testing is negative. COVID-19 testing pending. Likely viral however we did discussed red flag signs and symptoms and that he would need a chest x ray should symptoms worsen. We do not have X ray imaging here today. For now rest, hydration with water, tessalon and flonase.  Final Clinical Impressions(s) / UC Diagnoses   Final diagnoses:  None   Discharge Instructions   None    ED Prescriptions   None    PDMP not reviewed this encounter.   Hughie Closs, [place] 09/11/21 1123

## 2021-09-12 LAB — SARS-COV-2, NAA 2 DAY TAT

## 2021-09-12 LAB — NOVEL CORONAVIRUS, NAA: SARS-CoV-2, NAA: DETECTED — AB

## 2021-10-03 ENCOUNTER — Other Ambulatory Visit: Payer: Self-pay

## 2021-10-03 ENCOUNTER — Encounter: Payer: Self-pay | Admitting: *Deleted

## 2021-10-03 DIAGNOSIS — M791 Myalgia, unspecified site: Secondary | ICD-10-CM | POA: Insufficient documentation

## 2021-10-03 DIAGNOSIS — M542 Cervicalgia: Secondary | ICD-10-CM | POA: Insufficient documentation

## 2021-10-03 DIAGNOSIS — S4992XA Unspecified injury of left shoulder and upper arm, initial encounter: Secondary | ICD-10-CM | POA: Diagnosis present

## 2021-10-03 DIAGNOSIS — S40012A Contusion of left shoulder, initial encounter: Secondary | ICD-10-CM | POA: Insufficient documentation

## 2021-10-03 DIAGNOSIS — Y92410 Unspecified street and highway as the place of occurrence of the external cause: Secondary | ICD-10-CM | POA: Diagnosis not present

## 2021-10-03 DIAGNOSIS — I10 Essential (primary) hypertension: Secondary | ICD-10-CM | POA: Diagnosis not present
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
## 2021-10-04 ENCOUNTER — Emergency Department
Admission: EM | Admit: 2021-10-04 | Discharge: 2021-10-04 | Disposition: A | Discharge status: Home / Self Care | Payer: Medicaid Other | Attending: Emergency Medicine | Admitting: Emergency Medicine

## 2021-10-04 ENCOUNTER — Emergency Department: Payer: Medicaid Other

## 2021-10-04 DIAGNOSIS — M7918 Myalgia, other site: Secondary | ICD-10-CM

## 2021-10-04 DIAGNOSIS — T07XXXA Unspecified multiple injuries, initial encounter: Secondary | ICD-10-CM

## 2021-10-04 MED ORDER — OXYCODONE-ACETAMINOPHEN 5-325 MG PO TABS
2.0000 | ORAL_TABLET | Freq: Once | ORAL | Status: AC
  Administered 2021-10-04: 2 via ORAL
  Filled 2021-10-04: qty 2

## 2021-10-04 MED ORDER — IBUPROFEN 600 MG PO TABS
600.0000 mg | ORAL_TABLET | Freq: Once | ORAL | Status: AC
  Administered 2021-10-04: 600 mg via ORAL
  Filled 2021-10-04: qty 1

## 2021-10-04 NOTE — Discharge Instructions (Signed)

## 2021-10-04 NOTE — ED Notes (Signed)
E-signature pad unavailable - Pt verbalized understanding of D/C information - no additional concerns at this time.  

## 2021-10-04 NOTE — ED Notes (Signed)
Pt provided a sandwich box and water.  °
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
## 2022-04-11 ENCOUNTER — Other Ambulatory Visit
Admission: RE | Admit: 2022-04-11 | Discharge: 2022-04-11 | Disposition: A | Discharge status: Home / Self Care | Payer: Medicaid Other | Source: Ambulatory Visit | Attending: Internal Medicine | Admitting: Internal Medicine

## 2022-04-11 DIAGNOSIS — R062 Wheezing: Secondary | ICD-10-CM | POA: Diagnosis present

## 2022-04-11 DIAGNOSIS — R079 Chest pain, unspecified: Secondary | ICD-10-CM | POA: Diagnosis present

## 2022-04-11 LAB — D-DIMER, QUANTITATIVE: D-Dimer, Quant: <0.27 ug/mL-FEU (ref 0.00–0.50)

## 2022-05-21 ENCOUNTER — Other Ambulatory Visit: Payer: Self-pay | Admitting: Orthopedic Surgery

## 2022-06-06 ENCOUNTER — Ambulatory Visit
Admission: EM | Admit: 2022-06-06 | Discharge: 2022-06-06 | Disposition: A | Discharge status: Home / Self Care | Payer: Medicaid Other

## 2022-06-06 DIAGNOSIS — S0502XA Injury of conjunctiva and corneal abrasion without foreign body, left eye, initial encounter: Secondary | ICD-10-CM

## 2022-06-06 DIAGNOSIS — S2341XA Sprain of ribs, initial encounter: Secondary | ICD-10-CM | POA: Diagnosis not present

## 2022-06-06 MED ORDER — FLUORESCEIN SODIUM 1 MG OP STRP: 1.0000 | ORAL_STRIP | Freq: Once | OPHTHALMIC | Status: DC

## 2022-06-06 MED ORDER — TETRACAINE HCL 0.5 % OP SOLN: 2.0000 [drp] | Freq: Once | OPHTHALMIC | Status: DC

## 2022-06-06 MED ORDER — TOBRAMYCIN-DEXAMETHASONE 0.3-0.1 % OP SUSP
2.0000 [drp] | Freq: Four times a day (QID) | OPHTHALMIC | 0 refills | Status: AC
Start: 2022-06-06 — End: ?

## 2022-06-06 MED ORDER — BACLOFEN 10 MG PO TABS
10.0000 mg | ORAL_TABLET | Freq: Three times a day (TID) | ORAL | 0 refills | Status: DC
Start: 2022-06-06 — End: 2022-07-07

## 2022-06-06 MED ORDER — ONDANSETRON 8 MG PO TBDP: 8.0000 mg | ORAL_TABLET | Freq: Three times a day (TID) | ORAL | 0 refills | Status: DC | PRN

## 2022-06-06 NOTE — Discharge Instructions (Addendum)
Instill 2 drops of TobraDex in your left eye 4 times a day for 5 days.  If you have any worsening of your symptoms such as increased pain, increased redness, increased photosensitivity, or changes in your vision you need to follow-up with your eye doctor.   Take the previously prescribed tramadol as needed for pain.  You can take baclofen 10 mg every 8 hours as needed for muscle pain.  You can also apply topical lidocaine patches to your chest wall as needed for pain. These are OTC.  Use the Zofran every 8 hours as needed for nausea.

## 2022-06-06 NOTE — ED Triage Notes (Addendum)
Pt c/o LT rib injury, pt states he bent down to pick something up yesterday & twisted wrong. Pt states he did take some tramadol for pain this morning. Pt states pain causing him to have a headache & feeling nauseated.  Pt c/o LT eye pain, red, blurry x1 week

## 2022-06-06 NOTE — ED Provider Notes (Signed)
[facility]    CSN: [id number] Arrival date & time: 06/06/22  1150      History   Chief Complaint Chief Complaint  Patient presents with   Rib Injury   Eye Problem   Headache   Nausea    HPI Jose Zimmerman is a 36 y.o. male.   HPI  36 year old male here for evaluation of multiple complaints.  Patient's first complaint is that he has been having pain in his left posterior ribs that began yesterday after he bent down to pick something up, twisted, and felt a pop.  He denies any shortness of breath or hemoptysis.  He states that he is starting to get headache and nausea from the pain.  He is prescribed tramadol for labral tear in his left shoulder he states that he took that which did help somewhat with the pain.  His bigger concern is the nausea.  Patient is also been experiencing some pain and redness to his left eye with blurry vision for 1 week.  He denies any drainage from the eye.  No known injury.  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 Authorizing Provider  baclofen (LIORESAL) 10 MG tablet Take 1 tablet (10 mg total) by mouth 3 (three) times daily. 06/06/22  Yes Becky Augustayan, Idaly Verret, NP  cyclobenzaprine (FLEXERIL) 10 MG tablet Take 1 tablet by mouth 3 (three) times  daily as needed. 11/29/21  Yes [provider]  ibuprofen (ADVIL) 600 MG tablet Take 1 tablet (600 mg total) by mouth every 6 (six) hours as needed. 12/22/19  Yes Lamptey, Britta MccreedyPhilip O, MD  ondansetron (ZOFRAN-ODT) 8 MG disintegrating tablet Take 1 tablet (8 mg total) by mouth every 8 (eight) hours as needed for nausea or vomiting. 06/06/22  Yes Becky Augustayan, Tyra Michelle, NP  tobramycin-dexamethasone [facility](TOBRADEX) ophthalmic solution Place 2 drops into the right eye every 6 (six) hours. 06/06/22  Yes Becky Augustayan, Sunnie Odden, NP  traMADol (ULTRAM) 50 MG tablet Take by mouth. 12/26/21  Yes [provider]  benzonatate (TESSALON) 100 MG capsule Take 1 capsule (100 mg total) by mouth every 8 (eight) hours. 09/11/21   Rushie Chestnutovington, Sarah M, PA-C  busPIRone (BUSPAR) 10 MG tablet Take 1 tablet (10 mg total) by mouth 3 (three) times daily. 09/21/19   Money, Gerlene Burdockravis B, FNP  clonazePAM (KLONOPIN) 0.5 MG tablet Take 0.5 mg by mouth 3 (three) times daily as needed for anxiety.    [provider]  DULoxetine (CYMBALTA) 60 MG capsule Take 1 capsule (60 mg total) by mouth daily. 09/22/19   Money, Gerlene Burdockravis B, FNP  fluticasone (FLONASE) 50 MCG/ACT nasal spray Place 2 sprays into both nostrils daily. 09/11/21   Rushie Chestnutovington, Sarah M, PA-C  hydrOXYzine (ATARAX/VISTARIL) 25 MG tablet Take 1 tablet (25 mg total) by  mouth every 6 (six) hours as needed for anxiety. 09/21/19   Money, Gerlene Burdock, FNP  traZODone (DESYREL) 100 MG tablet Take 1 tablet (100 mg total) by mouth at bedtime as needed for sleep. 09/21/19   Money, Gerlene Burdock, FNP    Family History Family History  Problem Relation Age of Onset   Osteoporosis Mother    Diabetes Father    Hypertension Father    Hyperlipidemia Father    Crohn's disease Other     Social History Social History   Tobacco Use   Smoking status: Every Day    Packs/day: 2.00    Years: 10.00    Total pack years: 20.00    Types: Cigarettes   Smokeless tobacco: Former   Tobacco comments:    12/22/19 2 cig/day   Vaping Use   Vaping Use: Every day   Substances: Flavoring  Substance Use Topics   Alcohol use: Not Currently    Comment: quit June 2018   Drug use: No     Allergies   Gabapentin and Keppra [levetiracetam]   Review of Systems Review of Systems  Constitutional:  Negative for fever.  Eyes:  Positive for pain, redness and visual disturbance. Negative for photophobia, discharge and itching.  Respiratory:  Negative for shortness of breath.   Cardiovascular:  Positive for chest pain.       Posterior chest wall pain.  Gastrointestinal:  Positive for nausea.  Musculoskeletal: Negative.   Skin: Negative.   Neurological:  Positive for headaches.  Hematological: Negative.   Psychiatric/Behavioral: Negative.       Physical Exam Triage Vital Signs ED Triage Vitals  Enc Vitals Group     BP 06/06/22 1234 (!) 136/90     Pulse Rate 06/06/22 1234 97     Resp --      Temp 06/06/22 1234 97.7 F (36.5 C)     Temp Source 06/06/22 1234 Oral     SpO2 06/06/22 1234 99 %     Weight 06/06/22 1233 160 lb (72.6 kg)     Height 06/06/22 1233 5\' 11"  (1.803 m)     Head Circumference --      Peak Flow --      Pain Score 06/06/22 1233 7     Pain Loc --      Pain Edu? --      Excl. in GC? --    No data found.  Updated Vital Signs BP (!) 136/90 (BP Location: Right Arm)   Pulse 97   Temp 97.7 F (36.5 C) (Oral)   Ht 5\' 11"  (1.803 m)   Wt 160 lb (72.6 kg)   SpO2 99%   BMI 22.32 kg/m   Visual Acuity Right Eye Distance: 20/25 (Uncorrected) Left Eye Distance: 20/25 (Uncorrected) Bilateral Distance: 20/25 (Uncorrected)  Right Eye Near:   Left Eye Near:    Bilateral Near:     Physical Exam Vitals and nursing note reviewed.  Constitutional:      Appearance: Normal appearance. He is not ill-appearing.  HENT:     Head: Normocephalic and atraumatic.  Eyes:     General: No scleral icterus.       Left eye: No discharge.     Extraocular Movements: Extraocular movements intact.      Conjunctiva/sclera: Conjunctivae normal.     Pupils: Pupils are equal, round, and reactive to light.  Cardiovascular:     Rate and Rhythm: Normal rate and regular rhythm.     Pulses: Normal pulses.  Heart sounds: Normal heart sounds. No murmur heard.    No friction rub. No gallop.  Pulmonary:     Effort: Pulmonary effort is normal.     Breath sounds: Normal breath sounds. No wheezing, rhonchi or rales.  Chest:     Chest wall: Tenderness present.  Skin:    General: Skin is warm and dry.     Capillary Refill: Capillary refill takes less than 2 seconds.     Findings: No bruising, erythema or rash.  Neurological:     General: No focal deficit present.     Mental Status: He is alert and oriented to person, place, and time.  Psychiatric:        Mood and Affect: Mood normal.        Behavior: Behavior normal.        Thought Content: Thought content normal.        Judgment: Judgment normal.      UC Treatments / Results  Labs (all labs ordered are listed, but only abnormal results are displayed) Labs Reviewed - No data to display  EKG   Radiology No results found.  Procedures Procedures (including critical care time)  Medications Ordered in UC Medications  tetracaine (PONTOCAINE) 0.5 % ophthalmic solution 2 drop (has no administration in time range)  fluorescein ophthalmic strip 1 strip (has no administration in time range)    Initial Impression / Assessment and Plan / UC Course  I have reviewed the triage vital signs and the nursing notes.  Pertinent labs & imaging results that were available during my care of the patient were reviewed by me and considered in my medical decision making (see chart for details).   Patient is a nontoxic-appearing 36 year old male here for evaluation of ocular and chest wall complaints as outlined in HPI above.  On exam patient is very mild erythema to the inner aspect of the left eye outside of the iris.  No other surrounding erythema  noted.  No foreign bodies noted.  His pupils equal round reactive and EOMs intact.  I anesthetized the eye with 2 drops of tetracaine and then instilled fluorescein dye for examination under Woods lamp.  There does appear to be a corneal abrasion on the inferior medial aspect of the eye, also outside of the iris.  Patient has no alterations to his visual acuity as visual acuity OU was 20/25, OD 20/25, OS 20/25.  I will treat patient for corneal abrasion with TobraDex, 2 drops 4 times a day x5 days.  Patient's rib pain began after he bent over to pick something up and then twisted and felt a pop in the left posterior ribs.  He is indicating pain over the posterior sixth or seventh rib.  There is no overlying bruising, edema, or erythema of the skin.  No crepitus with palpation the patient does have some mild tenderness.  Patient's chest excursion is normal and his lung sounds are clear to auscultation all fields.  Suspect that patient has strained his ribs and we will have him continue taking the tramadol that he has been previously prescribed as he states Naprosyn makes him nauseous.  He does have Voltaren gel at home and advised him that he can apply that to his skin topically 4 times a day to help with pain.  He may also use topical lidocaine patches.  Dealt with the muscle pain from the rib strain I will prescribe baclofen that he can take every 8 hours.  I will also prescribe  Zofran that he can use for nausea.  Return precautions reviewed.  Work note provided.   Final Clinical Impressions(s) / UC Diagnoses   Final diagnoses:  Abrasion of left cornea, initial encounter  Rib sprain, initial encounter     Discharge Instructions      Instill 2 drops of TobraDex in your left eye 4 times a day for 5 days.  If you have any worsening of your symptoms such as increased pain, increased redness, increased photosensitivity, or changes in your vision you need to follow-up with your eye doctor.   Take the  previously prescribed tramadol as needed for pain.  You can take baclofen 10 mg every 8 hours as needed for muscle pain.  You can also apply topical lidocaine patches to your chest wall as needed for pain. These are OTC.  Use the Zofran every 8 hours as needed for nausea.      ED Prescriptions     Medication Sig Dispense Auth. Provider   tobramycin-dexamethasone [facility]) ophthalmic solution Place 2 drops into the right eye every 6 (six) hours. 5 mL Margarette Canada, NP   baclofen (LIORESAL) 10 MG tablet Take 1 tablet (10 mg total) by mouth 3 (three) times daily. 30 each Margarette Canada, NP   ondansetron (ZOFRAN-ODT) 8 MG disintegrating tablet Take 1 tablet (8 mg total) by mouth every 8 (eight) hours as needed for nausea or vomiting. 20 tablet Margarette Canada, NP      I have reviewed the PDMP during this encounter.   Margarette Canada, NP 06/06/22 1302

## 2022-07-07 ENCOUNTER — Encounter: Payer: Self-pay | Admitting: Orthopedic Surgery

## 2022-07-08 ENCOUNTER — Encounter: Payer: Self-pay | Admitting: General Practice

## 2022-07-11 ENCOUNTER — Ambulatory Visit: Admission: RE | Admit: 2022-07-11 | Payer: Medicaid Other | Source: Ambulatory Visit | Admitting: Orthopedic Surgery

## 2022-07-11 HISTORY — DX: Presence of dental prosthetic device (complete) (partial): Z97.2

## 2022-07-11 SURGERY — ARTHROSCOPY, SHOULDER
Anesthesia: Choice | Site: Shoulder | Laterality: Left
